# Patient Record
Sex: Female | Born: 1960 | Race: White | Hispanic: Yes | Marital: Married | Smoking: Never smoker
Health system: Southern US, Community
[De-identification: ages and names within clinical notes are randomized; demographics above are authoritative.]

## PROBLEM LIST (undated history)

## (undated) DIAGNOSIS — I10 Essential (primary) hypertension: Secondary | ICD-10-CM

## (undated) DIAGNOSIS — M1990 Unspecified osteoarthritis, unspecified site: Secondary | ICD-10-CM

## (undated) HISTORY — PX: ABDOMINAL HYSTERECTOMY: SHX81

## (undated) HISTORY — PX: CHOLECYSTECTOMY: SHX55

---

## 2019-12-11 LAB — HM DEXA SCAN

## 2021-08-11 ENCOUNTER — Encounter (HOSPITAL_BASED_OUTPATIENT_CLINIC_OR_DEPARTMENT_OTHER): Payer: Self-pay | Admitting: Urology

## 2021-08-11 ENCOUNTER — Inpatient Hospital Stay (HOSPITAL_BASED_OUTPATIENT_CLINIC_OR_DEPARTMENT_OTHER)
Admission: EM | Admit: 2021-08-11 | Discharge: 2021-08-15 | DRG: 493 | Disposition: A | Discharge status: Home Health | Payer: 59 | Attending: Orthopedic Surgery | Admitting: Orthopedic Surgery

## 2021-08-11 ENCOUNTER — Other Ambulatory Visit: Payer: Self-pay

## 2021-08-11 ENCOUNTER — Emergency Department (HOSPITAL_BASED_OUTPATIENT_CLINIC_OR_DEPARTMENT_OTHER): Payer: 59

## 2021-08-11 DIAGNOSIS — S82101A Unspecified fracture of upper end of right tibia, initial encounter for closed fracture: Secondary | ICD-10-CM

## 2021-08-11 DIAGNOSIS — Z20822 Contact with and (suspected) exposure to covid-19: Secondary | ICD-10-CM | POA: Diagnosis present

## 2021-08-11 DIAGNOSIS — Z90710 Acquired absence of both cervix and uterus: Secondary | ICD-10-CM

## 2021-08-11 DIAGNOSIS — S82141A Displaced bicondylar fracture of right tibia, initial encounter for closed fracture: Principal | ICD-10-CM | POA: Diagnosis present

## 2021-08-11 DIAGNOSIS — W1789XA Other fall from one level to another, initial encounter: Secondary | ICD-10-CM | POA: Diagnosis present

## 2021-08-11 DIAGNOSIS — Z9049 Acquired absence of other specified parts of digestive tract: Secondary | ICD-10-CM

## 2021-08-11 DIAGNOSIS — R52 Pain, unspecified: Secondary | ICD-10-CM

## 2021-08-11 DIAGNOSIS — I10 Essential (primary) hypertension: Secondary | ICD-10-CM | POA: Diagnosis present

## 2021-08-11 DIAGNOSIS — M898X9 Other specified disorders of bone, unspecified site: Secondary | ICD-10-CM | POA: Diagnosis present

## 2021-08-11 DIAGNOSIS — D62 Acute posthemorrhagic anemia: Secondary | ICD-10-CM | POA: Diagnosis not present

## 2021-08-11 DIAGNOSIS — F419 Anxiety disorder, unspecified: Secondary | ICD-10-CM | POA: Diagnosis present

## 2021-08-11 DIAGNOSIS — T148XXA Other injury of unspecified body region, initial encounter: Secondary | ICD-10-CM

## 2021-08-11 HISTORY — DX: Essential (primary) hypertension: I10

## 2021-08-11 LAB — BASIC METABOLIC PANEL
Anion gap: 9 (ref 5–15)
BUN: 26 mg/dL — ABNORMAL HIGH (ref 6–20)
CO2: 26 mmol/L (ref 22–32)
Calcium: 8.8 mg/dL — ABNORMAL LOW (ref 8.9–10.3)
Chloride: 101 mmol/L (ref 98–111)
Creatinine, Ser: 0.75 mg/dL (ref 0.44–1.00)
GFR, Estimated: >60 mL/min (ref >60)
Glucose, Bld: 191 mg/dL — ABNORMAL HIGH (ref 70–99)
Potassium: 3.7 mmol/L (ref 3.5–5.1)
Sodium: 136 mmol/L (ref 135–145)

## 2021-08-11 LAB — CBC WITH DIFFERENTIAL/PLATELET
Abs Immature Granulocytes: 0.03 10*3/uL (ref 0.00–0.07)
Basophils Absolute: 0.1 10*3/uL (ref 0.0–0.1)
Basophils Relative: 0 %
Eosinophils Absolute: 0 10*3/uL (ref 0.0–0.5)
Eosinophils Relative: 0 %
HCT: 33.7 % — ABNORMAL LOW (ref 36.0–46.0)
Hemoglobin: 11.4 g/dL — ABNORMAL LOW (ref 12.0–15.0)
Immature Granulocytes: 0 %
Lymphocytes Relative: 13 %
Lymphs Abs: 1.7 10*3/uL (ref 0.7–4.0)
MCH: 30.8 pg (ref 26.0–34.0)
MCHC: 33.8 g/dL (ref 30.0–36.0)
MCV: 91.1 fL (ref 80.0–100.0)
Monocytes Absolute: 0.8 10*3/uL (ref 0.1–1.0)
Monocytes Relative: 6 %
Neutro Abs: 11.1 10*3/uL — ABNORMAL HIGH (ref 1.7–7.7)
Neutrophils Relative %: 81 %
Platelets: 234 10*3/uL (ref 150–400)
RBC: 3.7 MIL/uL — ABNORMAL LOW (ref 3.87–5.11)
RDW: 12.5 % (ref 11.5–15.5)
WBC: 13.6 10*3/uL — ABNORMAL HIGH (ref 4.0–10.5)
nRBC: 0 % (ref 0.0–0.2)

## 2021-08-11 LAB — RESP PANEL BY RT-PCR (FLU A&B, COVID) ARPGX2
Influenza A by PCR: NEGATIVE
Influenza B by PCR: NEGATIVE
SARS Coronavirus 2 by RT PCR: NEGATIVE

## 2021-08-11 LAB — PROTIME-INR
INR: 1 (ref 0.8–1.2)
Prothrombin Time: 13.1 seconds (ref 11.4–15.2)

## 2021-08-11 MED ORDER — LACTATED RINGERS IV SOLN: INTRAVENOUS | Status: DC

## 2021-08-11 MED ORDER — KETOROLAC TROMETHAMINE 15 MG/ML IJ SOLN
15.0000 mg | Freq: Four times a day (QID) | INTRAMUSCULAR | Status: DC
  Administered 2021-08-11 – 2021-08-15 (×15): 15 mg via INTRAVENOUS
  Filled 2021-08-11 (×16): qty 1

## 2021-08-11 MED ORDER — OXYCODONE HCL 5 MG PO TABS
10.0000 mg | ORAL_TABLET | ORAL | Status: DC | PRN
  Administered 2021-08-12: 15 mg via ORAL
  Administered 2021-08-13: 10 mg via ORAL
  Administered 2021-08-14 (×2): 15 mg via ORAL
  Filled 2021-08-11 (×2): qty 3
  Filled 2021-08-11: qty 2
  Filled 2021-08-11: qty 3
  Filled 2021-08-11: qty 2
  Filled 2021-08-11: qty 3

## 2021-08-11 MED ORDER — HYDROMORPHONE HCL 1 MG/ML IJ SOLN
0.5000 mg | INTRAMUSCULAR | Status: DC | PRN
Start: 2021-08-11 — End: 2021-08-16
  Administered 2021-08-11 – 2021-08-13 (×3): 0.5 mg via INTRAVENOUS
  Filled 2021-08-11: qty 1
  Filled 2021-08-11 (×2): qty 0.5

## 2021-08-11 MED ORDER — DEXTROSE 5 % IV SOLN
500.0000 mg | Freq: Three times a day (TID) | INTRAVENOUS | Status: DC
  Filled 2021-08-11: qty 5

## 2021-08-11 MED ORDER — DIPHENHYDRAMINE HCL 12.5 MG/5ML PO ELIX
12.5000 mg | ORAL_SOLUTION | ORAL | Status: DC | PRN
Start: 2021-08-11 — End: 2021-08-16

## 2021-08-11 MED ORDER — DOCUSATE SODIUM 100 MG PO CAPS
100.0000 mg | ORAL_CAPSULE | Freq: Two times a day (BID) | ORAL | Status: DC
  Administered 2021-08-11 – 2021-08-15 (×8): 100 mg via ORAL
  Filled 2021-08-11 (×8): qty 1

## 2021-08-11 MED ORDER — ONDANSETRON HCL 4 MG PO TABS
4.0000 mg | ORAL_TABLET | Freq: Four times a day (QID) | ORAL | Status: DC | PRN
  Administered 2021-08-15: 4 mg via ORAL
  Filled 2021-08-11 (×2): qty 1

## 2021-08-11 MED ORDER — ONDANSETRON HCL 4 MG/2ML IJ SOLN
4.0000 mg | Freq: Four times a day (QID) | INTRAMUSCULAR | Status: DC | PRN
  Administered 2021-08-11 – 2021-08-13 (×3): 4 mg via INTRAVENOUS
  Filled 2021-08-11 (×3): qty 2

## 2021-08-11 MED ORDER — ACETAMINOPHEN 325 MG PO TABS
325.0000 mg | ORAL_TABLET | Freq: Four times a day (QID) | ORAL | Status: DC | PRN
  Filled 2021-08-11: qty 2

## 2021-08-11 MED ORDER — ACETAMINOPHEN 500 MG PO TABS
1000.0000 mg | ORAL_TABLET | Freq: Four times a day (QID) | ORAL | Status: DC
  Administered 2021-08-11 – 2021-08-15 (×14): 1000 mg via ORAL
  Filled 2021-08-11 (×16): qty 2

## 2021-08-11 MED ORDER — OXYCODONE HCL 5 MG PO TABS
5.0000 mg | ORAL_TABLET | ORAL | Status: DC | PRN
  Administered 2021-08-13: 10 mg via ORAL

## 2021-08-11 MED ORDER — HYDROMORPHONE HCL 1 MG/ML IJ SOLN
0.5000 mg | INTRAMUSCULAR | Status: DC | PRN
  Administered 2021-08-11: 1 mg via INTRAVENOUS
  Filled 2021-08-11: qty 1

## 2021-08-11 MED ORDER — METHOCARBAMOL 750 MG PO TABS
750.0000 mg | ORAL_TABLET | Freq: Three times a day (TID) | ORAL | Status: DC
  Administered 2021-08-11 – 2021-08-15 (×8): 750 mg via ORAL
  Filled 2021-08-11 (×10): qty 1
  Filled 2021-08-11: qty 2
  Filled 2021-08-11: qty 1

## 2021-08-11 NOTE — ED Provider Notes (Signed)
[facility] EMERGENCY DEPARTMENT Provider Note   CSN: [id number] Arrival date & time: 08/11/21  1847     History Chief Complaint  Patient presents with   Fall   Knee Injury    Faith Stevens is a 60 y.o. female.  HPI Patient has history of hypertension and is otherwise healthy.  She was standing on about a 3 foot loading dock moving a box and misjudged the edge, she fell backwards and stepped down with all of her weight on the right lower extremity.  The knee buckled and went back when she hit the ground.  Patient reports she got a minor abrasion to her right forearm but otherwise denies other injury.  She denies any loss of consciousness.  She has no headache.  Patient is not anticoagulated.  She denies any back pain weakness numbness or tingling.    No past medical history on file.  There are no problems to display for this patient.      OB History   No obstetric history on file.     No family history on file.     Home Medications Prior to Admission medications   Not on File    Allergies    Patient has no allergy information on record.  Review of Systems   Review of Systems 10 systems reviewed and negative except as per HPI Physical Exam Updated Vital Signs BP 140/67 (BP Location: Left Arm)   Pulse 80   Temp 98.6 F (37 C) (Oral)   Resp 20   Ht 5\' 4"  (1.626 m)   Wt 90.7 kg   SpO2 100%   BMI 34.33 kg/m   Physical Exam Constitutional:      Comments: Alert nontoxic otherwise well in appearance.  Patient is tolerating injury well.  HENT:     Head: Normocephalic and atraumatic.     Mouth/Throat:     Pharynx: Oropharynx is clear.  Eyes:     Extraocular Movements: Extraocular movements intact.     Conjunctiva/sclera: Conjunctivae normal.  Neck:     Comments: No midline C-spine tenderness. Cardiovascular:     Rate and Rhythm: Normal rate and regular rhythm.  Pulmonary:     Effort: Pulmonary effort is normal.     Breath sounds: Normal  breath sounds.  Chest:     Chest wall: No tenderness.  Abdominal:     General: There is no distension.     Palpations: Abdomen is soft.     Tenderness: There is no abdominal tenderness. There is no guarding.  Musculoskeletal:     Comments: Patient's right lower extremity is elevated in the wheelchair.  Lower part of the leg does not have any swelling.  Foot is warm and dry.  Patient can move the foot spontaneously.  Patient has superficial abrasion that is already dressed on the right forearm but has normal range of motion of the elbow and wrist with no deformity and no joint effusions.  Skin:    General: Skin is warm and dry.  Neurological:     General: No focal deficit present.     Mental Status: She is oriented to person, place, and time.     Motor: No weakness.     Coordination: Coordination normal.  Psychiatric:        Mood and Affect: Mood normal.    ED Results / Procedures / Treatments   Labs (all labs ordered are listed, but only abnormal results are displayed) Labs Reviewed  RESP PANEL BY  RT-PCR (FLU A&B, COVID) ARPGX2  BASIC METABOLIC PANEL  CBC WITH DIFFERENTIAL/PLATELET  PROTIME-INR    EKG None  Radiology DG Knee Complete 4 Views Right  Result Date: 08/11/2021 CLINICAL DATA:  Fall with knee pain and swelling EXAM: RIGHT KNEE - COMPLETE 4+ VIEW COMPARISON:  None. FINDINGS: Acute highly comminuted intra-articular fracture involving the proximal tibia with impaction and splaying of fracture fragments by central tibial fracture fragment. Acute highly comminuted fracture involving the head and neck of the fibula with about 1/2 shaft diameter anterior displacement of distal fracture fragment. IMPRESSION: 1. Acute highly comminuted intra-articular proximal tibial fracture with impaction and displacement 2. Acute highly comminuted fracture of the fibular head and neck with displacement Electronically Signed   By: Jasmine Pang M.D.   On: 08/11/2021 20:01     Procedures Procedures   Medications Ordered in ED Medications  HYDROmorphone (DILAUDID) injection 0.5 mg (has no administration in time range)    ED Course  I have reviewed the triage vital signs and the nursing notes.  Pertinent labs & imaging results that were available during my care of the patient were reviewed by me and considered in my medical decision making (see chart for details).    MDM Rules/Calculators/A&P                           Consult: Reviewed with Dr. Marcello Fennel orthopedics, plan for admission.  Patient had a mechanical fall resulting in badly comminuted and impacted tibia and fibula fracture.  No other significant associated injuries.  Patient is otherwise healthy except controlled hypertension.  Patient is tolerating injury well.  Consultation with orthopedics with plan for admission. Final Clinical Impression(s) / ED Diagnoses Final diagnoses:  Closed fracture of proximal end of right tibia and fibula, initial encounter    Rx / DC Orders ED Discharge Orders     None        Arby Barrette, MD 08/11/21 2124

## 2021-08-11 NOTE — ED Triage Notes (Signed)
States fell from loading dock today at 1800, states right knee pain and swelling.

## 2021-08-11 NOTE — ED Notes (Signed)
Pt positioned sideways in backseat of vehicle. Pt helped into locked wheelchair x2 MAX assist with a stand and pivot, maintaining an outstretched position of her RLE d/t increased pain to the knee with movement.

## 2021-08-12 ENCOUNTER — Inpatient Hospital Stay (HOSPITAL_COMMUNITY): Payer: 59

## 2021-08-12 ENCOUNTER — Encounter (HOSPITAL_COMMUNITY): Payer: Self-pay | Admitting: Orthopedic Surgery

## 2021-08-12 DIAGNOSIS — Z9049 Acquired absence of other specified parts of digestive tract: Secondary | ICD-10-CM | POA: Diagnosis not present

## 2021-08-12 DIAGNOSIS — S82141A Displaced bicondylar fracture of right tibia, initial encounter for closed fracture: Secondary | ICD-10-CM | POA: Diagnosis present

## 2021-08-12 DIAGNOSIS — Z20822 Contact with and (suspected) exposure to covid-19: Secondary | ICD-10-CM | POA: Diagnosis present

## 2021-08-12 DIAGNOSIS — S82101A Unspecified fracture of upper end of right tibia, initial encounter for closed fracture: Secondary | ICD-10-CM | POA: Diagnosis present

## 2021-08-12 DIAGNOSIS — F419 Anxiety disorder, unspecified: Secondary | ICD-10-CM | POA: Diagnosis present

## 2021-08-12 DIAGNOSIS — D62 Acute posthemorrhagic anemia: Secondary | ICD-10-CM | POA: Diagnosis not present

## 2021-08-12 DIAGNOSIS — W1789XA Other fall from one level to another, initial encounter: Secondary | ICD-10-CM | POA: Diagnosis present

## 2021-08-12 DIAGNOSIS — I10 Essential (primary) hypertension: Secondary | ICD-10-CM | POA: Diagnosis present

## 2021-08-12 DIAGNOSIS — M898X9 Other specified disorders of bone, unspecified site: Secondary | ICD-10-CM | POA: Diagnosis present

## 2021-08-12 DIAGNOSIS — Z90710 Acquired absence of both cervix and uterus: Secondary | ICD-10-CM | POA: Diagnosis not present

## 2021-08-12 LAB — CBC
HCT: 31.2 % — ABNORMAL LOW (ref 36.0–46.0)
Hemoglobin: 10.4 g/dL — ABNORMAL LOW (ref 12.0–15.0)
MCH: 30.9 pg (ref 26.0–34.0)
MCHC: 33.3 g/dL (ref 30.0–36.0)
MCV: 92.6 fL (ref 80.0–100.0)
Platelets: 210 10*3/uL (ref 150–400)
RBC: 3.37 MIL/uL — ABNORMAL LOW (ref 3.87–5.11)
RDW: 12.5 % (ref 11.5–15.5)
WBC: 11.2 10*3/uL — ABNORMAL HIGH (ref 4.0–10.5)
nRBC: 0 % (ref 0.0–0.2)

## 2021-08-12 LAB — VITAMIN D 25 HYDROXY (VIT D DEFICIENCY, FRACTURES): Vit D, 25-Hydroxy: 39.61 ng/mL (ref 30–100)

## 2021-08-12 LAB — HIV ANTIBODY (ROUTINE TESTING W REFLEX): HIV Screen 4th Generation wRfx: NONREACTIVE

## 2021-08-12 MED ORDER — CEFAZOLIN SODIUM-DEXTROSE 2-4 GM/100ML-% IV SOLN
2.0000 g | INTRAVENOUS | Status: AC
  Administered 2021-08-13: 2 g via INTRAVENOUS
  Filled 2021-08-12: qty 100

## 2021-08-12 MED ORDER — NEBIVOLOL HCL 10 MG PO TABS
20.0000 mg | ORAL_TABLET | Freq: Every day | ORAL | Status: DC
  Administered 2021-08-12 – 2021-08-15 (×4): 20 mg via ORAL
  Filled 2021-08-12 (×5): qty 2

## 2021-08-12 MED ORDER — ALPRAZOLAM 0.5 MG PO TABS
0.5000 mg | ORAL_TABLET | Freq: Every day | ORAL | Status: DC | PRN
  Administered 2021-08-14 – 2021-08-15 (×3): 0.5 mg via ORAL
  Filled 2021-08-12 (×4): qty 1

## 2021-08-12 NOTE — Progress Notes (Signed)
Pt refuses to use the bedpan stating that "every little move makes her pain really bad. "Per Dr Carola Frost it's ok for the pt to use a purewick for tonight but encourage her to use the bedpan.

## 2021-08-12 NOTE — Consult Note (Addendum)
Orthopaedic Trauma Service (OTS) Consult   Patient ID: Faith PollenSandra Stevens MRN: [id number] DOB/AGE: [birth year] 60 y.o.   Reason for Consult: Right bicondylar tibial plateau fracture  Referring Physician: Arby BarretteMarcy Pfeiffer, MD    HPI: Faith PollenSandra Ackers is an 60 y.o. 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  Fell approximately 4 feet directly onto her right leg.  Patient had immediate onset of pain and inability to bear weight.  She was [facility] where she was found to have a comminuted and shortened right bicondylar tibial plateau fracture.  Due to the severity of the injury she was [facility] to the orthopedic trauma service.   Patient is a comfortable pain is primarily located about her right knee.  She does not have any numbness or tingling into her foot but some numbness around the area of injury.  Denies any additional injuries elsewhere including her right hip and left lower extremities.  She did not hit her head or lose consciousness.  No other acute concerns or complaints noted by the patient.  Pain is relieved with rest, ice, elevation and pain medication.  Exacerbated with movement.  Pain is constant in nature.  Fortunately it has not worsened since admission.  Quality of pain is that is associated with fracture  Medical issues include hypertension and anxiety    No known drug allergies Past surgical history includes cholecystectomy and hysterectomy  Patient does not smoke does not drink She does admit to using occasional cannabis edibles.  She does this for anxiety.  Family history is noncontributory  History reviewed. No pertinent past medical history.  Past Surgical History:  Procedure Laterality Date   ABDOMINAL HYSTERECTOMY     CHOLECYSTECTOMY      History reviewed. No pertinent family history.  Social History:  has no history on file for  tobacco use, alcohol use, and drug use.  Allergies: No Known Allergies  Medications: I have reviewed the patient's current medications. Current Meds  Medication Sig   ALPRAZolam (XANAX) 1 MG tablet Take 0.5 mg by mouth daily as needed for anxiety.   Cholecalciferol (VITAMIN D3 PO) Take 1 tablet by mouth daily.   Cyanocobalamin (VITAMIN B-12 PO) Take 1 tablet by mouth daily.   Nebivolol HCl (BYSTOLIC) 20 MG TABS Take 20 mg by mouth every evening.   olmesartan (BENICAR) 20 MG tablet Take 20 mg by mouth daily.     Results for orders placed or performed during the hospital encounter of 08/11/21 (from the past 48 hour(s))  Basic metabolic panel     Status: Abnormal   Collection Time: 08/11/21  8:40 PM  Result Value Ref Range   Sodium 136 135 - 145 mmol/L   Potassium 3.7 3.5 - 5.1 mmol/L   Chloride 101 98 - 111 mmol/L   CO2 26 22 - 32 mmol/L   Glucose, Bld 191 (H) 70 - 99 mg/dL    Comment: Glucose reference range applies only to samples taken after fasting for at least 8 hours.   BUN 26 (H) 6 - 20 mg/dL   Creatinine, Ser 1.190.75 0.44 - 1.00 mg/dL   Calcium 8.8 (L) 8.9 - 10.3 mg/dL   GFR, Estimated >14>60 >78>60 mL/min    Comment: (NOTE) Calculated using the CKD-EPI Creatinine Equation (2021)    Anion gap 9 5 - 15    Comment:  Performed at [facility], [address] [place], [place] [address]  CBC with Differential     Status: Abnormal   Collection Time: 08/11/21  8:40 PM  Result Value Ref Range   WBC 13.6 (H) 4.0 - 10.5 K/uL   RBC 3.70 (L) 3.87 - 5.11 MIL/uL   Hemoglobin 11.4 (L) 12.0 - 15.0 g/dL   HCT 19.1 (L) 47.8 - 29.5 %   MCV 91.1 80.0 - 100.0 fL   MCH 30.8 26.0 - 34.0 pg   MCHC 33.8 30.0 - 36.0 g/dL   RDW 62.1 30.8 - 65.7 %   Platelets 234 150 - 400 K/uL   nRBC 0.0 0.0 - 0.2 %   Neutrophils Relative % 81 %   Neutro Abs 11.1 (H) 1.7 - 7.7 K/uL   Lymphocytes Relative 13 %   Lymphs Abs 1.7 0.7 - 4.0 K/uL   Monocytes Relative 6 %   Monocytes Absolute 0.8 0.1 - 1.0  K/uL   Eosinophils Relative 0 %   Eosinophils Absolute 0.0 0.0 - 0.5 K/uL   Basophils Relative 0 %   Basophils Absolute 0.1 0.0 - 0.1 K/uL   Immature Granulocytes 0 %   Abs Immature Granulocytes 0.03 0.00 - 0.07 K/uL    Comment: Performed at [facility], [address] [place], [place] [address]  Protime-INR     Status: None   Collection Time: 08/11/21  8:40 PM  Result Value Ref Range   Prothrombin Time 13.1 11.4 - 15.2 seconds   INR 1.0 0.8 - 1.2    Comment: (NOTE) INR goal varies based on device and disease states. Performed at [facility], [address] [place], [place] [address]   Resp Panel by RT-PCR (Flu A&B, Covid) Nasopharyngeal Swab     Status: None   Collection Time: 08/11/21  8:41 PM   Specimen: Nasopharyngeal Swab; Nasopharyngeal(NP) swabs in vial transport medium  Result Value Ref Range   SARS Coronavirus 2 by RT PCR NEGATIVE NEGATIVE    Comment: (NOTE) SARS-CoV-2 target nucleic acids are NOT DETECTED.  The SARS-CoV-2 RNA is generally detectable in upper respiratory specimens during the acute phase of infection. The lowest concentration of SARS-CoV-2 viral copies this assay can detect is 138 copies/mL. A negative result does not preclude SARS-Cov-2 infection and should not be used as the sole basis for treatment or other patient management decisions. A negative result may occur with  improper specimen collection/handling, submission of specimen other than nasopharyngeal swab, presence of viral mutation(s) within the areas targeted by this assay, and inadequate number of viral copies(<138 copies/mL). A negative result must be combined with clinical observations, patient history, and epidemiological information. The expected result is Negative.  Fact Sheet for Patients:  BloggerCourse.com  Fact Sheet for Healthcare Providers:  SeriousBroker.it  This test is no t yet approved or cleared  by the [place] FDA and  has been authorized for detection and/or diagnosis of SARS-CoV-2 by FDA under an Emergency Use Authorization (EUA). This EUA will remain  in effect (meaning this test can be used) for the duration of the COVID-19 declaration under Section 564(b)(1) of the Act, 21 U.S.C.section 360bbb-3(b)(1), unless the authorization is terminated  or revoked sooner.       Influenza A by PCR NEGATIVE NEGATIVE   Influenza B by PCR NEGATIVE NEGATIVE    Comment: (NOTE) The Xpert Xpress SARS-CoV-2/FLU/RSV plus assay is intended as an aid in the diagnosis of influenza from  Nasopharyngeal swab specimens and should not be used as a sole basis for treatment. Nasal washings and aspirates are unacceptable for Xpert Xpress SARS-CoV-2/FLU/RSV testing.  Fact Sheet for Patients: BloggerCourse.com  Fact Sheet for Healthcare Providers: SeriousBroker.it  This test is not yet approved or cleared by the [place] FDA and has been authorized for detection and/or diagnosis of SARS-CoV-2 by FDA under an Emergency Use Authorization (EUA). This EUA will remain in effect (meaning this test can be used) for the duration of the COVID-19 declaration under Section 564(b)(1) of the Act, 21 U.S.C. section 360bbb-3(b)(1), unless the authorization is terminated or revoked.  Performed at [facility], [address] [place], [place] [address]   HIV Antibody (routine testing w rflx)     Status: None   Collection Time: 08/12/21  1:42 AM  Result Value Ref Range   HIV Screen 4th Generation wRfx Non Reactive Non Reactive    Comment: Performed at [facility], [address] [place], [place] [address]  CBC     Status: Abnormal   Collection Time: 08/12/21  1:42 AM  Result Value Ref Range   WBC 11.2 (H) 4.0 - 10.5 K/uL   RBC 3.37 (L) 3.87 - 5.11 MIL/uL   Hemoglobin 10.4 (L) 12.0 - 15.0 g/dL   HCT 38.8 (L) 87.5 - 79.7 %   MCV  92.6 80.0 - 100.0 fL   MCH 30.9 26.0 - 34.0 pg   MCHC 33.3 30.0 - 36.0 g/dL   RDW 28.2 06.0 - 15.6 %   Platelets 210 150 - 400 K/uL   nRBC 0.0 0.0 - 0.2 %    Comment: Performed at [facility], [address] [place], [place] [address]  VITAMIN D 25 Hydroxy (Vit-D Deficiency, Fractures)     Status: None   Collection Time: 08/12/21  1:42 AM  Result Value Ref Range   Vit D, 25-Hydroxy 39.61 30 - 100 ng/mL    Comment: (NOTE) Vitamin D deficiency has been defined by the Institute of Medicine  and an Endocrine Society practice guideline as a level of serum 25-OH  vitamin D less than 20 ng/mL (1,2). The Endocrine Society went on to  further define vitamin D insufficiency as a level between 21 and 29  ng/mL (2).  1. IOM (Institute of Medicine). 2010. Dietary reference intakes for  calcium and D. [place]: [employer]. 2. Holick MF, Binkley Treasure, Bischoff-Ferrari HA, et al. Evaluation,  treatment, and prevention of vitamin D deficiency: an Endocrine  Society clinical practice guideline, JCEM. 2011 Jul; 96(7): 1911-30.  Performed at [facility], [address], [place], [place] [address]     DG Knee Complete 4 Views Right  Result Date: 08/11/2021 CLINICAL DATA:  Fall with knee pain and swelling EXAM: RIGHT KNEE - COMPLETE 4+ VIEW COMPARISON:  None. FINDINGS: Acute highly comminuted intra-articular fracture involving the proximal tibia with impaction and splaying of fracture fragments by central tibial fracture fragment. Acute highly comminuted fracture involving the head and neck of the fibula with about 1/2 shaft diameter anterior displacement of distal fracture fragment. IMPRESSION: 1. Acute highly comminuted intra-articular proximal tibial fracture with impaction and displacement 2. Acute highly comminuted fracture of the fibular head and neck with displacement Electronically Signed   By: Jasmine Pang M.D.   On: 08/11/2021 20:01    Intake/Output       08/12 0701 08/13 0700 08/13 0701 08/14 0700   P.O. 240  I.V. (mL/kg) 188.3 (2.1)    Total Intake(mL/kg) 428.3 (4.7)    Urine (mL/kg/hr) 500    Total Output 500    Net -71.7         Urine Occurrence 1 x       Review of Systems  Constitutional:  Negative for chills and fever.  HENT:  Negative for congestion and sinus pain.   Eyes:  Negative for blurred vision.  Respiratory:  Negative for shortness of breath and wheezing.   Cardiovascular:  Negative for chest pain and palpitations.  Gastrointestinal:  Negative for abdominal pain, nausea and vomiting.  Genitourinary:  Negative for dysuria.  Musculoskeletal:        R knee pain   Skin:  Negative for rash.  Neurological:  Negative for dizziness, weakness and headaches.  Endo/Heme/Allergies:  Does not bruise/bleed easily.  Blood pressure 140/77, pulse 80, temperature 97.7 F (36.5 C), temperature source Oral, resp. rate 18, height 5\' 4"  (1.626 m), weight 90.7 kg, SpO2 97 %. Physical Exam Vitals and nursing note reviewed.  Constitutional:      General: She is awake. She is not in acute distress.    Appearance: Normal appearance. She is well-groomed. She is not ill-appearing.     Comments: Very pleasant female, no acute distress appears well.  Very engaged and interactive  HENT:     Head: Normocephalic and atraumatic.     Mouth/Throat:     Mouth: Mucous membranes are moist.  Eyes:     Extraocular Movements: Extraocular movements intact.  Cardiovascular:     Rate and Rhythm: Normal rate and regular rhythm.     Pulses: Normal pulses.     Heart sounds: Normal heart sounds.  Pulmonary:     Effort: Pulmonary effort is normal.     Breath sounds: Normal breath sounds.  Abdominal:     Palpations: Abdomen is soft.     Tenderness: There is no abdominal tenderness.  Musculoskeletal:     Cervical back: Full passive range of motion without pain and normal range of motion. No rigidity or tenderness.     Comments: Right Lower extremity   Mobilizer and bulky compressive dressing are in place No gross deformities noted to foot or ankle.  No deformities noted to the hip Dressing was partially split at the level of the knee.  There is fairly moderate swelling and the skin does not readily wrinkle with gentle compression.  Right lower leg is appreciably larger than the contralateral side however her compartments remain soft and are nontender I do not appreciate any fracture blisters however I did not remove her compressive dressing completely Ankle is stable with evaluation.  Ankle and foot are nontender.  No crepitus or gross motion with manipulation of the ankle.  Hip is unremarkable.  No pain with palpation of her right hip. DPN, SPN, TN sensory functions are intact EHL, FHL, lesser toe motor functions are intact.  Ankle flexion and extension, inversion eversion are intact.   No pain out of proportion with passive stretching of her toes or ankle Extremity is warm + DP pulse  Left lower extremity             no open wounds or lesions, no swelling or ecchymosis   Nontender hip, knee, ankle and foot             No crepitus or gross motion noted with manipulation of the L leg  No knee or ankle effusion  No pain with axial loading or logrolling of the hip. Negative Stinchfield test   Knee stable to varus/ valgus and anterior/posterior stress             No pain with manipulation of the ankle or foot             No blocks to motion noted  Sens DPN, SPN, TN intact  Motor EHL, FHL, lesser toe motor, Ext, flex, evers 5/5  DP 2+, No significant edema             Compartments are soft and nontender, no pain with passive stretching   Bilateral upper extremities     shoulder, elbow, wrist, digits- no skin wounds, nontender, no instability, no blocks to motion  Sens  Ax/R/M/U intact  Mot   Ax/ R/ PIN/ M/ AIN/ U intact  Rad 2+     Skin:    General: Skin is warm.     Capillary Refill: Capillary refill takes less than 2  seconds.  Neurological:     General: No focal deficit present.     Mental Status: She is alert and oriented to person, place, and time. Mental status is at baseline.     Comments: Did not assess coordination or gait  Psychiatric:        Attention and Perception: Attention and perception normal.        Mood and Affect: Mood and affect normal.        Speech: Speech normal.        Behavior: Behavior normal. Behavior is cooperative.        Thought Content: Thought content normal.        Cognition and Memory: Cognition and memory normal.        Judgment: Judgment normal.    Assessment/Plan:  60 y/o female s/p fall off loading dock with comminuted R bicondylar tibial plateau fracture   -Fall off loading dock  -Bicondylar tibial plateau fracture  Patient will require surgical intervention.  Unfortunately her swelling is too severe at the moment to allow for safe definitive fixation.  Plan for external fixation tomorrow morning with delayed ORIF in 10 to 14 days.  CT scan post op to fully characterize fracture pattern to determine if single or dual approach needed    No signs of evolving compartment syndrome   Continue with bulky compressive dressing and knee immobilizer.  Ice and elevate extremity to help with swelling control as well   Okay to start working with therapies   Nonweightbearing right leg.   She will be nonweightbearing for now and for 8 weeks after definitive fixation  - Pain management:  Multimodal  - ABL anemia/Hemodynamics  Stable  Baseline hypertension controlled on home medications - Medical issues   Hypertension   Home medications - DVT/PE prophylaxis:  Start Lovenox today  - ID:   Perioperative antibiotics  - Metabolic Bone Disease:  Vitamin D levels look okay.  Continue with routine supplementation.  Mechanism was fairly high-energy however patient would benefit from having a bone density scan in the next 4 to 8 weeks as she is postmenopausal  -  Activity:  Out of bed with assistance, nonweightbearing right leg  - FEN/GI prophylaxis/Foley/Lines:  Regular diet  NPO after MN   IVF LR at 100 cc/hr  - Dispo:  OR tomorrow for external fixation right leg  Therapy evaluations  Likely dc Tuesday     Mearl Latin, PA-C [phone] (C) 08/12/2021, 10:16 AM  [facility] [address] [place] [address] [phone] Collier Bullock (F)    After 5pm and on the weekends please log on to Amion, go to orthopaedics and the look under the [facility] Call for the provider(s) on call. You can also call our office at [phone] and then follow the prompts to be connected to the call team.

## 2021-08-12 NOTE — Plan of Care (Signed)
  Problem: Education: Goal: Knowledge of General Education information will improve Description: Including pain rating scale, medication(s)/side effects and non-pharmacologic comfort measures Outcome: Progressing   Problem: Coping: Goal: Level of anxiety will decrease Outcome: Progressing   Problem: Pain Managment: Goal: General experience of comfort will improve Outcome: Progressing   

## 2021-08-13 ENCOUNTER — Encounter (HOSPITAL_COMMUNITY)
Admission: EM | Disposition: A | Discharge status: Home Health | Payer: Self-pay | Source: Home / Self Care | Attending: Orthopedic Surgery

## 2021-08-13 ENCOUNTER — Encounter (HOSPITAL_COMMUNITY): Payer: Self-pay | Admitting: Orthopedic Surgery

## 2021-08-13 ENCOUNTER — Inpatient Hospital Stay (HOSPITAL_COMMUNITY): Payer: 59 | Admitting: Anesthesiology

## 2021-08-13 ENCOUNTER — Inpatient Hospital Stay (HOSPITAL_COMMUNITY): Payer: 59

## 2021-08-13 HISTORY — PX: EXTERNAL FIXATION LEG: SHX1549

## 2021-08-13 LAB — SURGICAL PCR SCREEN
MRSA, PCR: NEGATIVE
Staphylococcus aureus: NEGATIVE

## 2021-08-13 SURGERY — EXTERNAL FIXATION, LOWER EXTREMITY
Anesthesia: General | Site: Leg Lower | Laterality: Right

## 2021-08-13 MED ORDER — ACETAMINOPHEN 10 MG/ML IV SOLN: 1000.0000 mg | Freq: Once | INTRAVENOUS | Status: DC | PRN

## 2021-08-13 MED ORDER — FENTANYL CITRATE (PF) 100 MCG/2ML IJ SOLN
25.0000 ug | INTRAMUSCULAR | Status: DC | PRN
  Administered 2021-08-13: 50 ug via INTRAVENOUS
  Administered 2021-08-13 (×2): 25 ug via INTRAVENOUS

## 2021-08-13 MED ORDER — ONDANSETRON HCL 4 MG/2ML IJ SOLN
INTRAMUSCULAR | Status: DC | PRN
  Administered 2021-08-13: 4 mg via INTRAVENOUS

## 2021-08-13 MED ORDER — MIDAZOLAM HCL 2 MG/2ML IJ SOLN
INTRAMUSCULAR | Status: DC | PRN
  Administered 2021-08-13: 2 mg via INTRAVENOUS

## 2021-08-13 MED ORDER — MIDAZOLAM HCL 2 MG/2ML IJ SOLN
INTRAMUSCULAR | Status: AC
  Filled 2021-08-13: qty 2

## 2021-08-13 MED ORDER — KETAMINE HCL 50 MG/5ML IJ SOSY
PREFILLED_SYRINGE | INTRAMUSCULAR | Status: AC
  Filled 2021-08-13: qty 5

## 2021-08-13 MED ORDER — OXYCODONE HCL 5 MG/5ML PO SOLN
5.0000 mg | Freq: Once | ORAL | Status: DC | PRN
Start: 2021-08-13 — End: 2021-08-13

## 2021-08-13 MED ORDER — DEXAMETHASONE SODIUM PHOSPHATE 10 MG/ML IJ SOLN
INTRAMUSCULAR | Status: DC | PRN
  Administered 2021-08-13: 8 mg via INTRAVENOUS

## 2021-08-13 MED ORDER — LACTATED RINGERS IV SOLN: INTRAVENOUS | Status: DC | PRN

## 2021-08-13 MED ORDER — PHENYLEPHRINE 40 MCG/ML (10ML) SYRINGE FOR IV PUSH (FOR BLOOD PRESSURE SUPPORT)
PREFILLED_SYRINGE | INTRAVENOUS | Status: DC | PRN
  Administered 2021-08-13: 80 ug via INTRAVENOUS

## 2021-08-13 MED ORDER — ORAL CARE MOUTH RINSE: 15.0000 mL | Freq: Once | OROMUCOSAL | Status: AC

## 2021-08-13 MED ORDER — 0.9 % SODIUM CHLORIDE (POUR BTL) OPTIME
TOPICAL | Status: DC | PRN
  Administered 2021-08-13: 1000 mL

## 2021-08-13 MED ORDER — ONDANSETRON HCL 4 MG/2ML IJ SOLN
INTRAMUSCULAR | Status: AC
  Filled 2021-08-13: qty 2

## 2021-08-13 MED ORDER — OXYCODONE HCL 5 MG PO TABS: 5.0000 mg | ORAL_TABLET | Freq: Once | ORAL | Status: DC | PRN

## 2021-08-13 MED ORDER — LACTATED RINGERS IV SOLN: INTRAVENOUS | Status: DC

## 2021-08-13 MED ORDER — KETAMINE HCL 10 MG/ML IJ SOLN
INTRAMUSCULAR | Status: DC | PRN
  Administered 2021-08-13 (×2): 20 mg via INTRAVENOUS

## 2021-08-13 MED ORDER — PROPOFOL 10 MG/ML IV BOLUS
INTRAVENOUS | Status: AC
  Filled 2021-08-13: qty 20

## 2021-08-13 MED ORDER — FENTANYL CITRATE (PF) 100 MCG/2ML IJ SOLN
INTRAMUSCULAR | Status: AC
  Filled 2021-08-13: qty 2

## 2021-08-13 MED ORDER — PHENYLEPHRINE HCL-NACL 20-0.9 MG/250ML-% IV SOLN
INTRAVENOUS | Status: DC | PRN
Start: 2021-08-13 — End: 2021-08-13
  Administered 2021-08-13: 25 ug/min via INTRAVENOUS

## 2021-08-13 MED ORDER — DEXAMETHASONE SODIUM PHOSPHATE 10 MG/ML IJ SOLN
INTRAMUSCULAR | Status: AC
  Filled 2021-08-13: qty 1

## 2021-08-13 MED ORDER — CHLORHEXIDINE GLUCONATE 0.12 % MT SOLN
OROMUCOSAL | Status: AC
  Filled 2021-08-13: qty 15

## 2021-08-13 MED ORDER — FENTANYL CITRATE (PF) 250 MCG/5ML IJ SOLN
INTRAMUSCULAR | Status: DC | PRN
  Administered 2021-08-13 (×2): 50 ug via INTRAVENOUS

## 2021-08-13 MED ORDER — FENTANYL CITRATE (PF) 250 MCG/5ML IJ SOLN
INTRAMUSCULAR | Status: AC
  Filled 2021-08-13: qty 5

## 2021-08-13 MED ORDER — PHENYLEPHRINE 40 MCG/ML (10ML) SYRINGE FOR IV PUSH (FOR BLOOD PRESSURE SUPPORT)
PREFILLED_SYRINGE | INTRAVENOUS | Status: AC
  Filled 2021-08-13: qty 10

## 2021-08-13 MED ORDER — ACETAMINOPHEN 160 MG/5ML PO SOLN: 1000.0000 mg | Freq: Once | ORAL | Status: DC | PRN

## 2021-08-13 MED ORDER — ACETAMINOPHEN 500 MG PO TABS: 1000.0000 mg | ORAL_TABLET | Freq: Once | ORAL | Status: DC | PRN

## 2021-08-13 MED ORDER — PROPOFOL 10 MG/ML IV BOLUS
INTRAVENOUS | Status: DC | PRN
  Administered 2021-08-13: 150 mg via INTRAVENOUS

## 2021-08-13 MED ORDER — CHLORHEXIDINE GLUCONATE 0.12 % MT SOLN
15.0000 mL | Freq: Once | OROMUCOSAL | Status: AC
  Administered 2021-08-13: 15 mL via OROMUCOSAL

## 2021-08-13 SURGICAL SUPPLY — 45 items
BANDAGE ESMARK 6X9 LF (GAUZE/BANDAGES/DRESSINGS) ×1 IMPLANT
BAR EXFX 500X11 NS LF (EXFIX) ×2
BAR GLASS FIBER EXFX 11X500 (EXFIX) ×4 IMPLANT
BNDG CMPR 9X6 STRL LF SNTH (GAUZE/BANDAGES/DRESSINGS) ×1
BNDG ELASTIC 4X5.8 VLCR STR LF (GAUZE/BANDAGES/DRESSINGS) ×2 IMPLANT
BNDG ELASTIC 6X5.8 VLCR STR LF (GAUZE/BANDAGES/DRESSINGS) ×2 IMPLANT
BNDG ESMARK 6X9 LF (GAUZE/BANDAGES/DRESSINGS) ×2
BNDG GAUZE ELAST 4 BULKY (GAUZE/BANDAGES/DRESSINGS) ×4 IMPLANT
BRUSH SCRUB EZ PLAIN DRY (MISCELLANEOUS) ×4 IMPLANT
COVER SURGICAL LIGHT HANDLE (MISCELLANEOUS) ×4 IMPLANT
DRAPE C-ARM 42X72 X-RAY (DRAPES) ×2 IMPLANT
DRAPE C-ARMOR (DRAPES) ×2 IMPLANT
DRAPE U-SHAPE 47X51 STRL (DRAPES) ×2 IMPLANT
DRSG ADAPTIC 3X8 NADH LF (GAUZE/BANDAGES/DRESSINGS) ×2 IMPLANT
DRSG MEPITEL 4X7.2 (GAUZE/BANDAGES/DRESSINGS) ×2 IMPLANT
ELECT REM PT RETURN 9FT ADLT (ELECTROSURGICAL) ×2
ELECTRODE REM PT RTRN 9FT ADLT (ELECTROSURGICAL) ×1 IMPLANT
GAUZE SPONGE 4X4 12PLY STRL (GAUZE/BANDAGES/DRESSINGS) ×2 IMPLANT
GLOVE SRG 8 PF TXTR STRL LF DI (GLOVE) ×1 IMPLANT
GLOVE SURG ENC MOIS LTX SZ7.5 (GLOVE) ×2 IMPLANT
GLOVE SURG ENC MOIS LTX SZ8 (GLOVE) ×2 IMPLANT
GLOVE SURG UNDER POLY LF SZ7.5 (GLOVE) ×2 IMPLANT
GLOVE SURG UNDER POLY LF SZ8 (GLOVE) ×2
GOWN STRL REUS W/ TWL LRG LVL3 (GOWN DISPOSABLE) ×2 IMPLANT
GOWN STRL REUS W/ TWL XL LVL3 (GOWN DISPOSABLE) ×1 IMPLANT
GOWN STRL REUS W/TWL LRG LVL3 (GOWN DISPOSABLE) ×4
GOWN STRL REUS W/TWL XL LVL3 (GOWN DISPOSABLE) ×2
HALF PIN 5.0X160 (EXFIX) ×4 IMPLANT
KIT BASIN OR (CUSTOM PROCEDURE TRAY) ×2 IMPLANT
KIT TURNOVER KIT B (KITS) ×2 IMPLANT
NS IRRIG 1000ML POUR BTL (IV SOLUTION) ×2 IMPLANT
PACK ORTHO EXTREMITY (CUSTOM PROCEDURE TRAY) ×2 IMPLANT
PAD ARMBOARD 7.5X6 YLW CONV (MISCELLANEOUS) ×4 IMPLANT
PAD CAST 4YDX4 CTTN HI CHSV (CAST SUPPLIES) ×2 IMPLANT
PADDING CAST COTTON 4X4 STRL (CAST SUPPLIES) ×4
PADDING CAST COTTON 6X4 STRL (CAST SUPPLIES) ×2 IMPLANT
PIN CLAMP 2BAR 75MM BLUE (EXFIX) ×4 IMPLANT
PIN HALF YELLOW 5X160X35 (EXFIX) ×4 IMPLANT
SPONGE T-LAP 18X18 ~~LOC~~+RFID (SPONGE) ×2 IMPLANT
STAPLER VISISTAT 35W (STAPLE) ×2 IMPLANT
STRIP CLOSURE SKIN 1/2X4 (GAUZE/BANDAGES/DRESSINGS) IMPLANT
TOWEL GREEN STERILE (TOWEL DISPOSABLE) ×2 IMPLANT
TOWEL GREEN STERILE FF (TOWEL DISPOSABLE) ×2 IMPLANT
TRAY CATH INTERMITTENT SS 16FR (CATHETERS) ×2 IMPLANT
UNDERPAD 30X36 HEAVY ABSORB (UNDERPADS AND DIAPERS) ×2 IMPLANT

## 2021-08-13 NOTE — Anesthesia Preprocedure Evaluation (Signed)
Anesthesia Evaluation  Patient identified by MRN, date of birth, ID band Patient awake    Reviewed: Allergy & Precautions, NPO status , Patient's Chart, lab work & pertinent test results  History of Anesthesia Complications Negative for: history of anesthetic complications  Airway Mallampati: II  TM Distance: >3 FB Neck ROM: Full    Dental  (+) Dental Advisory Given, Teeth Intact   Pulmonary neg pulmonary ROS,  Covid-19 Nucleic Acid Test Results Lab Results      Component                Value               Date                      SARSCOV2NAA              NEGATIVE            08/11/2021              breath sounds clear to auscultation       Cardiovascular hypertension, Pt. on medications (-) angina(-) Past MI and (-) CHF  Rhythm:Regular     Neuro/Psych negative neurological ROS  negative psych ROS   GI/Hepatic negative GI ROS, Neg liver ROS,   Endo/Other  negative endocrine ROS  Renal/GU negative Renal ROS     Musculoskeletal  right bicondylar tibial plateau fracture   Abdominal   Peds  Hematology negative hematology ROS (+)   Anesthesia Other Findings   Reproductive/Obstetrics                             Anesthesia Physical Anesthesia Plan  ASA: 2  Anesthesia Plan: General   Post-op Pain Management:    Induction: Intravenous  PONV Risk Score and Plan: 3 and Ondansetron and Dexamethasone  Airway Management Planned: LMA  Additional Equipment: None  Intra-op Plan:   Post-operative Plan: Extubation in OR  Informed Consent: I have reviewed the patients History and Physical, chart, labs and discussed the procedure including the risks, benefits and alternatives for the proposed anesthesia with the patient or authorized representative who has indicated his/her understanding and acceptance.     Dental advisory given  Plan Discussed with: CRNA and  Anesthesiologist  Anesthesia Plan Comments:         Anesthesia Quick Evaluation

## 2021-08-13 NOTE — Progress Notes (Signed)
OT Cancellation Note  Patient Details Name: Faith Stevens MRN: [id number] DOB: July 02, 1961   Cancelled Treatment:    Reason Eval/Treat Not Completed: Patient at procedure or test/ unavailable (Pt off of floor to OR for external fixator placement, OT eval to f/u as appropriate)  Accalia Rigdon A Oria Klimas 08/13/2021, 8:39 AM

## 2021-08-13 NOTE — Progress Notes (Signed)
Occupational Therapy Evaluation Patient Details Name: Faith Stevens MRN: [id number] DOB: 1961-02-13 Today's Date: 08/13/2021    History of Present Illness Faith Stevens is a 60 y.o. female who presented to [facility] after  falling 3 foot off of a loading dock while moving a box, ED work up showed Bicondylar tibial plateau fracture. Pt is s/p external fixator 8/14 with plans for delayed ORIF in 10 to 14 days. Patient has history of hypertension and is otherwise healthy   Clinical Impression   Faith Stevens was evaluated s/p the above ex fix placement, she stated feeling "loopy" at the start of the session but agreeable to participate. PTA pt was indep in all ADL/ADLs, she lives on the main level of their home with her husband and family member who plan to take turns to provide pt with 24/7 care at d/c. After history was obtained and pt questions answered, we attempted to completedbed mobility and her pain went to 10/10 and she declined to further mobilize. Pt is anxious about the pain and has many questions of the process.  Pt will benefit from OT acutely to progress mobility and ADLs. Recommend d/c home with HHOT and 24/7 support.    Follow Up Recommendations  Home health OT;Supervision/Assistance - 24 hour    Equipment Recommendations  3 in 1 bedside commode;Wheelchair (measurements OT) (WC with elevatign leg rest, RW)       Precautions / Restrictions Precautions Precautions: Fall Precaution Comments: ex fix Restrictions Weight Bearing Restrictions: Yes RLE Weight Bearing: Non weight bearing      Mobility Bed Mobility Overal bed mobility: Needs Assistance Bed Mobility: Rolling Rolling: Max assist         General bed mobility comments: pt unable to complete bed mobility due to pain, attempted to adjust, scoot and roll in bed but pt reported 1010 pain    Transfers                 General transfer comment: pt declined    Balance Overall balance assessment: Needs assistance           ADL either performed or assessed with clinical judgement   ADL Overall ADL's : Needs assistance/impaired Eating/Feeding: Set up;Sitting   Grooming: Set up;Sitting   Upper Body Bathing: Set up;Sitting   Lower Body Bathing: Maximal assistance;Sit to/from stand;+2 for physical assistance;+2 for safety/equipment   Upper Body Dressing : Set up;Sitting   Lower Body Dressing: Maximal assistance;+2 for physical assistance;+2 for safety/equipment;Sit to/from stand   Toilet Transfer: Maximal assistance;+2 for physical assistance;+2 for safety/equipment;Stand-pivot;BSC;RW   Toileting- Clothing Manipulation and Hygiene: Maximal assistance;+2 for physical assistance;+2 for safety/equipment;Sit to/from stand       Functional mobility during ADLs: Maximal assistance;+2 for physical assistance;+2 for safety/equipment;Rolling walker General ADL Comments: Pt limited by painand axiety this session     Vision Patient Visual Report: No change from baseline Vision Assessment?: No apparent visual deficits            Pertinent Vitals/Pain Pain Assessment: 0-10 Pain Score: 8  Pain Location: RLE, knee mostly Pain Descriptors / Indicators: Discomfort;Grimacing Pain Intervention(s): Limited activity within patient's tolerance;Monitored during session     Hand Dominance     Extremity/Trunk Assessment Upper Extremity Assessment Upper Extremity Assessment: Overall WFL for tasks assessed   Lower Extremity Assessment Lower Extremity Assessment: Defer to PT evaluation   Cervical / Trunk Assessment Cervical / Trunk Assessment: Normal   Communication Communication Communication: No difficulties   Cognition Arousal/Alertness: Awake/alert Behavior During Therapy: [facility] for  tasks assessed/performed;Anxious Overall Cognitive Status: Within Functional Limits for tasks assessed         General Comments  Pt reported feeling "loopy" at the start of the session, likely due to meducation. She  was agreeable to the session however on attempt to move/adjust her pain went to 1010 and she declined further mobility     Home Living Family/patient expects to be discharged to:: Private residence Living Arrangements: Spouse/significant other Available Help at Discharge: Family;Available 24 hours/day Type of Home: House Home Access: Stairs to enter [employer] of Steps: 5 Entrance Stairs-Rails: Right Home Layout: Two level;Able to live on main level with bedroom/bathroom     Bathroom Shower/Tub: Tub/shower unit;Walk-in shower   Bathroom Toilet: Standard Bathroom Accessibility: Yes How Accessible: Accessible via walker Home Equipment: None          Prior Functioning/Environment Level of Independence: Independent                 OT Problem List: Decreased range of motion;Decreased strength;Decreased activity tolerance;Impaired balance (sitting and/or standing);Decreased safety awareness;Decreased knowledge of use of DME or AE;Decreased knowledge of precautions;Pain      OT Treatment/Interventions: Self-care/ADL training;Therapeutic exercise;Therapeutic activities;Patient/family education;Balance training;DME and/or AE instruction    OT Goals(Current goals can be found in the care plan section) Acute Rehab OT Goals Patient Stated Goal: home to shower OT Goal Formulation: With patient Time For Goal Achievement: 08/27/21 Potential to Achieve Goals: Fair ADL Goals Pt Will Perform Lower Body Bathing: with supervision;sitting/lateral leans Pt Will Perform Lower Body Dressing: with min assist;with adaptive equipment;sitting/lateral leans Pt Will Transfer to Toilet: with min guard assist;stand pivot transfer;bedside commode Pt Will Perform Tub/Shower Transfer: with min guard assist;ambulating;shower seat;rolling walker  OT Frequency: Min 2X/week   Barriers to D/C: Inaccessible home environment  5 STE          AM-PAC OT "6 Clicks" Daily Activity     Outcome  Measure Help from another person eating meals?: None Help from another person taking care of personal grooming?: A Little Help from another person toileting, which includes using toliet, bedpan, or urinal?: A Lot Help from another person bathing (including washing, rinsing, drying)?: A Lot Help from another person to put on and taking off regular upper body clothing?: A Little Help from another person to put on and taking off regular lower body clothing?: A Lot 6 Click Score: 16   End of Session Nurse Communication: Mobility status;Weight bearing status;Precautions  Activity Tolerance: Patient limited by pain Patient left: in bed;with call bell/phone within reach;with family/visitor present  OT Visit Diagnosis: Other abnormalities of gait and mobility (R26.89);Muscle weakness (generalized) (M62.81);Pain                Time: 9509-3267 OT Time Calculation (min): 18 min Charges:  OT General Charges $OT Visit: 1 Visit OT Evaluation $OT Eval Moderate Complexity: 1 Mod   Faith Stevens A Lynnleigh Soden 08/13/2021, 4:22 PM

## 2021-08-13 NOTE — Progress Notes (Signed)
Patient stated that she always takes her Benicar 20 mg PO in the mornings and before surgery.  Paging on call MD for order since this medication is not listed on her MAR.  Awaiting call back.  [phone] On call MD gave order for Benicar 20 mg PO for morning.

## 2021-08-13 NOTE — Op Note (Signed)
PATIENT:  Ayan Yankey  60 y.o.  PRE-OPERATIVE DIAGNOSIS:  Right bicondylar tibial plateau fracture  POST-OPERATIVE DIAGNOSIS:  Right bicondylar tibial plateau fracture  PROCEDURE:  Procedure(s): 1. CLOSED REDUCTION OF RIGHT TIBIAL PLATEAU 2. APPLICATION OF SPANNING EXTERNAL FIXATOR RIGHT KNEE  SURGEON:  Surgeon(s) and Role:    * Myrene Galas, MD - Primary  PHYSICIAN ASSISTANT: Montez Morita, PA-C  ANESTHESIA:   general  EBL:  minimal   BLOOD ADMINISTERED:none  DRAINS: none   LOCAL MEDICATIONS USED:  NONE  SPECIMEN:  No Specimen  DISPOSITION OF SPECIMEN:  N/A  COUNTS:  YES  TOURNIQUET:  * No tourniquets in log *  DICTATION: .Note written in EPIC  PLAN OF CARE: Admit to inpatient   PATIENT DISPOSITION:  PACU - hemodynamically stable.   Delay start of Pharmacological VTE agent (>24hrs) due to surgical blood loss or risk of bleeding: no  BRIEF SUMMARY OF INDICATIONS FOR PROCEDURE:  Cendy Oconnor is a 60 y.o. -year-old who sustained knee injury from fall from loading dock at work with severe impacted and displaced fractures of the tibial plateau. The patient was seen and evaluated preoperatively where substantial swelling and blisters but no pain with passive stretch was noted. I discussed the possibility of pin tract infection, the need for staged procedures, heart attack, stroke, anesthetic reaction, possible need for skin grafting, malunion, nonunion, infection, and multiple others.  BRIEF SUMMARY OF PROCEDURE:  After administration of preoperative antibiotics, the patient was taken to the operating room where general anesthesia was induced. The right lower extremity was prepped and draped in usual sterile fashion while carefully holding traction.  After time-out, C-arm was brought in.  Appropriate starting points were identified on the femur and tibia using a clamp for proper spacing and orientation.  Small stab incisions were made.  Tonsil clamp used to spread down to  bone.  The pins were advanced to the far cortex.  These were then checked with lateral flouro images to confirm appropriate position just into the far cortex.  After applying and securing clamps on the femur and the tibia, two bars were placed and a closed manipulation performed of both the tibial plateau and the shaft area restoring appropriate length, alignment, and rotation.  Once this was confirmed with AP and lateral C-arm images, my assistant terminally tightened all of the clamps. We dressed the pin sites with Kerlix and applied a sterile gently compressive dressing from foot to thigh, including application of Mepitel and guaze directly over the fracture blisters. An assistant was absolutely necessary as I had to produce and control the reduction while an assistant adjusted and tightened the clamps.  PROGNOSIS:  The patient will need to return to the OR for definitive treatment in approximately two weeks. CT scan will be ordered for surgical planning and DVT prophylaxis initiated. The magnitude of articular and soft tissue injury also increases the risks of arthritis and soft tissue complications including infection.   Doralee Albino. Carola Frost, M.D.

## 2021-08-13 NOTE — Transfer of Care (Signed)
Immediate Anesthesia Transfer of Care Note  Patient: Faith Stevens  Procedure(s) Performed: EXTERNAL FIXATION LEG (Right: Leg Lower)  Patient Location: PACU  Anesthesia Type:General  Level of Consciousness: drowsy and patient cooperative  Airway & Oxygen Therapy: Patient Spontanous Breathing and Patient connected to face mask oxygen  Post-op Assessment: Report given to RN and Post -op Vital signs reviewed and stable  Post vital signs: Reviewed and stable  Last Vitals:  Vitals Value Taken Time  BP    Temp    Pulse    Resp    SpO2      Last Pain:  Vitals:   08/13/21 0004  TempSrc:   PainSc: 5       Patients Stated Pain Goal: 3 (08/13/21 0004)  Complications: No notable events documented.

## 2021-08-13 NOTE — Progress Notes (Signed)
Tried to put in the order for Benicar PO. Hospital does not carry it.  There is an alternate to Benicar called Avapro.  Patient does not want the Avapro.  She stated that she has her own supply of Benicar and if the MD approves she will take it in the morning.

## 2021-08-13 NOTE — Progress Notes (Signed)
Patient is requesting to take her home med Benicar yet again.  I have explain several times that this medication is not on her MAR and that she did not want the hospital alternative Avapro.  I can not tell her to take home medications or medications not on her MAR.  She can discuss this with MD before surgery this morning as she agreed to do at 0100.  Her morning BP was 139/814 and HR 76.

## 2021-08-13 NOTE — Progress Notes (Signed)
PT Cancellation Note  Patient Details Name: Faith Stevens MRN: [id number] DOB: 12/09/1961   Cancelled Treatment:    Reason Eval/Treat Not Completed: Patient at procedure or test/unavailable (to OR for external fixator placement). PT will continue to follow-up with pt acutely as available and appropriate.    Alessandra Bevels Chiquita Heckert 08/13/2021, 8:18 AM

## 2021-08-13 NOTE — Anesthesia Procedure Notes (Signed)
Procedure Name: LMA Insertion Date/Time: 08/13/2021 9:50 AM Performed by: Modena Morrow, CRNA Pre-anesthesia Checklist: Patient identified, Emergency Drugs available, Suction available and Patient being monitored Patient Re-evaluated:Patient Re-evaluated prior to induction Oxygen Delivery Method: Circle system utilized Preoxygenation: Pre-oxygenation with 100% oxygen Induction Type: IV induction Ventilation: Mask ventilation without difficulty LMA: LMA inserted LMA Size: 4.0 Placement Confirmation: positive ETCO2 Tube secured with: Tape Dental Injury: Teeth and Oropharynx as per pre-operative assessment

## 2021-08-14 ENCOUNTER — Encounter (HOSPITAL_COMMUNITY): Payer: Self-pay | Admitting: Orthopedic Surgery

## 2021-08-14 DIAGNOSIS — F419 Anxiety disorder, unspecified: Secondary | ICD-10-CM

## 2021-08-14 DIAGNOSIS — I10 Essential (primary) hypertension: Secondary | ICD-10-CM | POA: Diagnosis present

## 2021-08-14 HISTORY — DX: Anxiety disorder, unspecified: F41.9

## 2021-08-14 HISTORY — DX: Essential (primary) hypertension: I10

## 2021-08-14 MED ORDER — ALPRAZOLAM 0.5 MG PO TABS: 0.5000 mg | ORAL_TABLET | Freq: Every day | ORAL | Status: DC | PRN

## 2021-08-14 MED ORDER — OLMESARTAN MEDOXOMIL 20 MG PO TABS
20.0000 mg | ORAL_TABLET | Freq: Every day | ORAL | Status: DC
  Administered 2021-08-14 – 2021-08-15 (×2): 20 mg via ORAL
  Filled 2021-08-14 (×2): qty 1

## 2021-08-14 MED ORDER — ENOXAPARIN SODIUM 40 MG/0.4ML IJ SOSY
40.0000 mg | PREFILLED_SYRINGE | INTRAMUSCULAR | Status: DC
  Administered 2021-08-14 – 2021-08-15 (×2): 40 mg via SUBCUTANEOUS
  Filled 2021-08-14 (×2): qty 0.4

## 2021-08-14 MED ORDER — VITAMIN D 25 MCG (1000 UNIT) PO TABS
1000.0000 [IU] | ORAL_TABLET | Freq: Every day | ORAL | Status: DC
  Administered 2021-08-14 – 2021-08-15 (×2): 1000 [IU] via ORAL
  Filled 2021-08-14 (×3): qty 1

## 2021-08-14 NOTE — Evaluation (Signed)
Physical Therapy Evaluation Patient Details Name: Faith Stevens MRN: [id number] DOB: January 14, 1961 Today's Date: 08/14/2021   History of Present Illness  Faith Stevens is a 60 y.o. female who presented to [facility] after  falling 3 foot off of a loading dock while moving a box, ED work up showed Bicondylar tibial plateau fracture. Pt is s/p external fixator 8/14 with plans for delayed ORIF in 10 to 14 days. Patient has history of hypertension and is otherwise healthy  Clinical Impression   Pt presents with impaired strength, RLE post-operative pain, impaired mobility, impaired balance, and decreased activity tolerance vs baseline. Pt to benefit from acute PT to address deficits. Pt tolerated stand pivot to recliner from bed, overall requiring min assist +2 for mobility. At baseline, pt completely independent. PT to progress mobility as tolerated, and will continue to follow acutely.      Follow Up Recommendations Home health PT;Supervision/Assistance - 24 hour    Equipment Recommendations  Rolling walker with 5" wheels;3in1 (PT);Wheelchair cushion (measurements PT);Wheelchair (measurements PT)    Recommendations for Other Services       Precautions / Restrictions Precautions Precautions: Fall Precaution Comments: ex fix - PT educated pt on using exfix bars to lift LE as opposed to pulling on RLE itself Restrictions Weight Bearing Restrictions: Yes RLE Weight Bearing: Non weight bearing      Mobility  Bed Mobility Overal bed mobility: Needs Assistance Bed Mobility: Supine to Sit Rolling: Min assist         General bed mobility comments: min A to manage RLE, pt with increased time and effort to scoot to EOB.    Transfers Overall transfer level: Needs assistance Equipment used: Rolling walker (2 wheeled) Transfers: Sit to/from [employer] Sit to Stand: Min assist;+2 physical assistance;+2 safety/equipment Stand pivot transfers: Min assist;+2 physical  assistance;+2 safety/equipment       General transfer comment: +2 this session for safety only, pt can progress with +1 for transfers. +2 helpful to progress ambulation distances. Assist for rise, steady, and safe pivot. PT reinforcing NWB RLE throughout  Ambulation/Gait                Stairs            Wheelchair Mobility    Modified Rankin (Stroke Patients Only)       Balance Overall balance assessment: Needs assistance Sitting-balance support: No upper extremity supported;Feet supported Sitting balance-Leahy Scale: Fair     Standing balance support: Bilateral upper extremity supported;During functional activity Standing balance-Leahy Scale: Poor Standing balance comment: reliant on external support                             Pertinent Vitals/Pain Pain Assessment: Faces Faces Pain Scale: Hurts little more Pain Location: RLE Pain Descriptors / Indicators: Discomfort;Grimacing Pain Intervention(s): Limited activity within patient's tolerance;Monitored during session;Repositioned    Home Living Family/patient expects to be discharged to:: Private residence Living Arrangements: Spouse/significant other Available Help at Discharge: Family;Available 24 hours/day Type of Home: House Home Access: Stairs to enter Entrance Stairs-Rails: Right Entrance Stairs-Number of Steps: 5 Home Layout: Two level;Able to live on main level with bedroom/bathroom Home Equipment: None      Prior Function Level of Independence: Independent               Hand Dominance        Extremity/Trunk Assessment   Upper Extremity Assessment Upper Extremity Assessment: Defer to OT evaluation  Lower Extremity Assessment Lower Extremity Assessment: RLE deficits/detail RLE Deficits / Details: ex fix limiting RLE eval RLE: Unable to fully assess due to immobilization    Cervical / Trunk Assessment Cervical / Trunk Assessment: Normal  Communication    Communication: No difficulties  Cognition Arousal/Alertness: Awake/alert Behavior During Therapy: WFL for tasks assessed/performed Overall Cognitive Status: Within Functional Limits for tasks assessed                                 General Comments: some anxiety with mobility      General Comments General comments (skin integrity, edema, etc.): No new concerns    Exercises     Assessment/Plan    PT Assessment Patient needs continued PT services  PT Problem List Decreased strength;Decreased mobility;Decreased safety awareness;Decreased activity tolerance;Decreased balance;Decreased knowledge of use of DME;Pain;Decreased knowledge of precautions;Decreased range of motion;Decreased coordination       PT Treatment Interventions DME instruction;Therapeutic activities;Gait training;Therapeutic exercise;Patient/family education;Balance training;Stair training;Functional mobility training;Neuromuscular re-education    PT Goals (Current goals can be found in the Care Plan section)  Acute Rehab PT Goals Patient Stated Goal: home PT Goal Formulation: With patient Time For Goal Achievement: 08/28/21 Potential to Achieve Goals: Good    Frequency Min 4X/week   Barriers to discharge        Co-evaluation               AM-PAC PT "6 Clicks" Mobility  Outcome Measure Help needed turning from your back to your side while in a flat bed without using bedrails?: A Little Help needed moving from lying on your back to sitting on the side of a flat bed without using bedrails?: A Little Help needed moving to and from a bed to a chair (including a wheelchair)?: A Little Help needed standing up from a chair using your arms (e.g., wheelchair or bedside chair)?: A Little Help needed to walk in hospital room?: A Lot Help needed climbing 3-5 steps with a railing? : A Lot 6 Click Score: 16    End of Session   Activity Tolerance: Patient tolerated treatment well Patient left: in  chair;with call bell/phone within reach;with chair alarm set Nurse Communication: Mobility status PT Visit Diagnosis: Other abnormalities of gait and mobility (R26.89);Pain Pain - Right/Left: Right Pain - part of body: Leg    Time: 8295-6213 PT Time Calculation (min) (ACUTE ONLY): 41 min   Charges:   PT Evaluation $PT Eval Low Complexity: 1 Low         Asher Babilonia S, PT DPT Acute Rehabilitation Services Pager [phone]  Office [phone]   Koichi Platte E Christain [place] 08/14/2021, 1:28 PM

## 2021-08-14 NOTE — Progress Notes (Addendum)
Orthopaedic Trauma Service Progress Note  Patient ID: Faith Stevens MRN: [id number] DOB/AGE: 09/27/1961 60 y.o.  Subjective:  Pain moderate but doing ok  Was able to go to the bathroom this am, + BM Asking lots of appropriate questions   No acute issues  Would like to dc home tomorrow    Does note that her L knee is her bad knee.  Will see how far she can mobilize using a walker, can use WC for longer distances   Review of Systems  Constitutional:  Negative for chills and fever.  Respiratory:  Negative for shortness of breath and wheezing.   Cardiovascular:  Negative for chest pain and palpitations.  Gastrointestinal:  Negative for abdominal pain, nausea and vomiting.  Genitourinary:  Negative for dysuria.  Neurological:  Negative for tingling and sensory change.  Psychiatric/Behavioral:  Negative for depression.   As above  Objective:   VITALS:   Vitals:   08/13/21 1233 08/13/21 1330 08/13/21 2000 08/14/21 0743  BP: 126/74 125/70 127/62 (!) 129/57  Pulse: 73 79 82 67  Resp:   15 17  Temp:   98.1 F (36.7 C) 98 F (36.7 C)  TempSrc:   Oral Oral  SpO2: 100% 100% 100% 99%  Weight:      Height:        Estimated body mass index is 34.33 kg/m as calculated from the following:   Height as of this encounter: 5\' 4"  (1.626 m).   Weight as of this encounter: 90.7 kg.   Intake/Output      08/14 0701 08/15 0700 08/15 0701 08/16 0700   I.V. (mL/kg) 400 (4.4)    IV Piggyback 100    Total Intake(mL/kg) 500 (5.5)    Urine (mL/kg/hr) 200 (0.1)    Blood 20    Total Output 220    Net +280           LABS  No results found for this or any previous visit (from the past 24 hour(s)).   PHYSICAL EXAM:   Gen: sitting up in bed, appears well, very pleasant  Lungs: unlabored Cardiac: Reg Ext:       Right Lower Extremity   Ex fix intact and stable  Bloody drainage on pinsite dressings but not  saturated  Compressive dressing intact   Ext warm   + DP pulse  No deep calf tenderness  Compartments are soft and nontender.  No pain out of proportion with passive stretching  Excellent active motion of her ankle.  Patient is able to actively extend beyond neutral  Ankle flexion, extension, inversion and eversion are intact  DPN, SPN, TN sensory functions intact  Assessment/Plan: 1 Day Post-Op   Principal Problem:   Closed bicondylar fracture of right tibial plateau Active Problems:   Essential hypertension   Anxiety   Anti-infectives (From admission, onward)    Start     Dose/Rate Route Frequency Ordered Stop   08/13/21 0800  ceFAZolin (ANCEF) IVPB 2g/100 mL premix        2 g 200 mL/hr over 30 Minutes Intravenous On call to O.R. 08/12/21 1014 08/13/21 1015     .  POD/HD#: 1  60 y/o female s/p fall off loading dock with comminuted R bicondylar tibial plateau fracture    -Fall off loading dock   -  R Bicondylar tibial plateau fracture s/p External fixation                NWB R leg    Ankle ROM as tolerated   Ankle theraband program and heel cord stretching    PT/OT        Will review pincare with pt and family tomorrow                  Ice and elevate R leg      Activity as tolerated while maintaining WB restrictions       Plan for office follow up 08/23/2021 and return to OR 08/24/2021   Think she will only require single approach based off CT scan.  There is a nondisplaced fracture along the posteriomedial plateau but not sure significant benefit could be had addressing this fragment     - Pain management:               Multimodal   - ABL anemia/Hemodynamics               Stable               Baseline hypertension controlled on home medications  - Medical issues                Hypertension                             Home medications     Elevated blood glucose        Will check A1c  - DVT/PE prophylaxis:               Lovenox    - ID:                 Perioperative antibiotics   - Metabolic Bone Disease:               Vitamin D levels look okay.  Continue with routine supplementation.               Mechanism was fairly high-energy however patient would benefit from having a bone density scan in the next 4 to 8 weeks as she is postmenopausal   - Activity:               Out of bed with assistance, nonweightbearing right leg   - FEN/GI prophylaxis/Foley/Lines:               Regular diet    NSL IV - Dispo:               therapies     DME    Home tomorrow   Mearl Latin, PA-C [phone] (C) 08/14/2021, 11:33 AM  [facility] [address] [place] [address] [phone] [phone] (F)    After 5pm and on the weekends please log on to Amion, go to orthopaedics and the look under the [facility] Call for the provider(s) on call. You can also call our office at [phone] and then follow the prompts to be connected to the call team.

## 2021-08-14 NOTE — Progress Notes (Addendum)
Occupational Therapy Treatment Patient Details Name: Faith Stevens MRN: [id number] DOB: March 11, 1961 Today's Date: 08/14/2021    History of present illness Faith Stevens is a 60 y.o. female who presented to [facility] after  falling 3 foot off of a loading dock while moving a box, ED work up showed Bicondylar tibial plateau fracture. Pt is s/p external fixator 8/14 with plans for delayed ORIF in 10 to 14 days. Patient has history of hypertension and is otherwise healthy   OT comments  [name] is progressing very well today, with better pain management and eager to participate with therapy. She completed bed mobility with min A for RLE, and SPT with RW given min A +2 for minimal physical assist and safety. Pt declined to progress further mobility at this time, +2 assist likely beneficial to progress distance. While sitting at the sink pt groomed with set up A and vc for WB status. Pt continues to benefit from continued OT acutely. D/c plan remains appropriate.    Follow Up Recommendations  Home health OT;Supervision/Assistance - 24 hour    Equipment Recommendations  3 in 1 bedside commode;Wheelchair (measurements OT)       Precautions / Restrictions Precautions Precautions: Fall Precaution Comments: ex fix Restrictions Weight Bearing Restrictions: Yes RLE Weight Bearing: Non weight bearing       Mobility Bed Mobility Overal bed mobility: Needs Assistance Bed Mobility: Supine to Sit Rolling: Min assist         General bed mobility comments: min A to manage RLE    Transfers Overall transfer level: Needs assistance Equipment used: Rolling walker (2 wheeled) Transfers: Sit to/from [employer] Sit to Stand: Min assist;+2 physical assistance;+2 safety/equipment Stand pivot transfers: Min assist;+2 physical assistance;+2 safety/equipment       General transfer comment: +2 this session for safety only, pt can progress with +1 for transfers. +2 helpful to progress  ambulation distances        ADL either performed or assessed with clinical judgement   ADL Overall ADL's : Needs assistance/impaired     Grooming: Sitting;Set up;Wash/dry face;Applying deodorant (hair wash with shower cap) Grooming Details (indicate cue type and reason): at the sink Upper Body Bathing: Set up;Sitting Upper Body Bathing Details (indicate cue type and reason): at the sink Lower Body Bathing: Set up;Sitting/lateral leans           Toilet Transfer: Minimal assistance;+2 for physical assistance;+2 for safety/equipment Toilet Transfer Details (indicate cue type and reason): simulated from bed>chair with RW given +2 A for physical assist to boost into standing and for safety         Functional mobility during ADLs: Minimal assistance;+2 for physical assistance;+2 for safety/equipment;Rolling walker;Cueing for safety;Cueing for sequencing General ADL Comments: +2 this session for pt safety, pt can progerss to +1 help only fro ADLs and transfers     Vision   Vision Assessment?: No apparent visual deficits    Cognition Arousal/Alertness: Awake/alert Behavior During Therapy: WFL for tasks assessed/performed Overall Cognitive Status: Within Functional Limits for tasks assessed                                                General Comments No new concerns    Pertinent Vitals/ Pain       Pain Assessment: Faces Faces Pain Scale: Hurts little more Pain Location: RLE, knee mostly Pain Descriptors /  Indicators: Discomfort;Grimacing Pain Intervention(s): Limited activity within patient's tolerance;Monitored during session   Frequency  Min 2X/week        Progress Toward Goals  OT Goals(current goals can now be found in the care plan section)  Progress towards OT goals: Progressing toward goals  Acute Rehab OT Goals Patient Stated Goal: home to shower OT Goal Formulation: With patient Time For Goal Achievement: 08/27/21 Potential to  Achieve Goals: Fair ADL Goals Pt Will Perform Lower Body Bathing: with supervision;sitting/lateral leans Pt Will Perform Lower Body Dressing: with min assist;with adaptive equipment;sitting/lateral leans Pt Will Transfer to Toilet: with min guard assist;stand pivot transfer;bedside commode Pt Will Perform Tub/Shower Transfer: with min guard assist;ambulating;shower seat;rolling walker  Plan Discharge plan remains appropriate    Co-evaluation                 AM-PAC OT "6 Clicks" Daily Activity     Outcome Measure   Help from another person eating meals?: None Help from another person taking care of personal grooming?: A Little Help from another person toileting, which includes using toliet, bedpan, or urinal?: A Lot Help from another person bathing (including washing, rinsing, drying)?: A Little Help from another person to put on and taking off regular upper body clothing?: A Little Help from another person to put on and taking off regular lower body clothing?: A Lot 6 Click Score: 17    End of Session Equipment Utilized During Treatment: Rolling walker  OT Visit Diagnosis: Other abnormalities of gait and mobility (R26.89);Muscle weakness (generalized) (M62.81);Pain   Activity Tolerance Patient tolerated treatment well   Patient Left in chair;with call bell/phone within reach;with family/visitor present;with chair alarm set   Nurse Communication Mobility status;Weight bearing status;Precautions        Time: 0321-2248 OT Time Calculation (min): 41 min  Charges: OT General Charges $OT Visit: 1 Visit OT Treatments $Self Care/Home Management : 23-37 mins    Mohamed Portlock A Wanona Stare 08/14/2021, 12:08 PM

## 2021-08-14 NOTE — Progress Notes (Signed)
Clinical impression: Faith Stevens was seen for splint check this session; foot plate still in great position, no redness or skin integrity issues present. Pt reports no increase in pain and tolerable experience of foot plate, she preferred to keep it on at the end of this session. Educated RN to remove foot plate at pt's request, and if any new concerns occur, remove splint and call OT rehab asap.     08/14/21 1700  OT Visit Information  Last OT Received On 08/14/21  Assistance Needed +1  History of Present Illness Faith Stevens is a 60 y.o. female who presented to [facility] after  falling 3 foot off of a loading dock while moving a box, ED work up showed Bicondylar tibial plateau fracture. Pt is s/p external fixator 8/14 with plans for delayed ORIF in 10 to 14 days. Patient has history of hypertension and is otherwise healthy  Precautions  Precautions Fall  Precaution Comments ex fix -  using exfix bars to lift LE as opposed to pulling on RLE itself  Required Braces or Orthoses Other Brace  Other Brace foot plate attached to ex fix  Pain Assessment  Pain Assessment 0-10  Pain Score 1  Pain Location RLE  Pain Descriptors / Indicators Discomfort  Pain Intervention(s) Limited activity within patient's tolerance;Monitored during session  Cognition  Arousal/Alertness Awake/alert  Behavior During Therapy WFL for tasks assessed/performed  Overall Cognitive Status Within Functional Limits for tasks assessed  General Comments  General comments (skin integrity, edema, etc.) Footplate splint check, no skin integrity issues notes. Per pt report, no incrased pain. Pt preference to continue to leave foot plate attached at the end of the session. Educated Charity fundraiser on how to remove plate, and how to don plate.  OT - End of Session  Activity Tolerance Patient tolerated treatment well  Patient left in bed;with call bell/phone within reach  Nurse Communication Other (comment) (splint care)  OT Assessment/Plan  OT Plan  Discharge plan remains appropriate  OT Visit Diagnosis Other abnormalities of gait and mobility (R26.89);Muscle weakness (generalized) (M62.81);Pain  Pain - Right/Left Right  Pain - part of body Leg  OT Frequency (ACUTE ONLY) Min 2X/week  Follow Up Recommendations Home health OT;Supervision/Assistance - 24 hour  OT Equipment 3 in 1 bedside commode;Wheelchair (measurements OT)  AM-PAC OT "6 Clicks" Daily Activity Outcome Measure (Version 2)  Help from another person eating meals? 4  Help from another person taking care of personal grooming? 3  Help from another person toileting, which includes using toliet, bedpan, or urinal? 2  Help from another person bathing (including washing, rinsing, drying)? 3  Help from another person to put on and taking off regular upper body clothing? 3  Help from another person to put on and taking off regular lower body clothing? 2  6 Click Score 17  Progressive Mobility  What is the highest level of mobility based on the progressive mobility assessment? Level 4 (Walks with assist in room) - Balance while marching in place and cannot step forward and back - Complete  Mobility Out of bed for toileting;Out of bed to chair with meals  OT Goal Progression  Progress towards OT goals Progressing toward goals  Acute Rehab OT Goals  Patient Stated Goal home  OT Goal Formulation With patient  Time For Goal Achievement 08/27/21  Potential to Achieve Goals Good  ADL Goals  Pt Will Perform Lower Body Bathing with supervision;sitting/lateral leans  Pt Will Perform Lower Body Dressing with min assist;with adaptive equipment;sitting/lateral leans  Pt Will Transfer to Toilet with min guard assist;stand pivot transfer;bedside commode  Pt Will Perform Tub/Shower Transfer with min guard assist;ambulating;shower seat;rolling walker  Additional ADL Goal #1 Pt will independently donn/doff footplate adn verbalize understanding of wear schedule  OT Time Calculation  OT Start Time  (ACUTE ONLY) 1656  OT Stop Time (ACUTE ONLY) 1705  OT Time Calculation (min) 9 min  OT General Charges  $OT Visit 1 Visit  OT Treatments  $Orthotics/Prosthetics Check 8-22 mins

## 2021-08-14 NOTE — Progress Notes (Addendum)
NCM called and spoke with pt's sister Serina Cowper per pt's request regarding workman's compensation case. Alisa informed NCM pt is from [place] and works for [employer] in [place] States pt was working the [employer] in [place] when fell and suffered tibial plateau fracture. Pt is s/p external fixator 8/14 with plans for delayed ORIF in 10 to 14 days. NCM explained to Alisa to notify pt's place of work( [employer] of pt's injury in order to process / obtain workmans compensation claim #. Serina Cowper stated she would f/u with pt's job. NCM called [employer] [phone]). Call unsuccessful, voice message left. TOC team will continue to monitor and follow for needs... Gae Gallop RN, [place] [phone]

## 2021-08-14 NOTE — Progress Notes (Signed)
Occupational Therapy Treatment Patient Details Name: Faith Stevens MRN: [id number] DOB: Jan 03, 1961 Today's Date: 08/14/2021    History of present illness Faith Stevens is a 60 y.o. female who presented to [facility] after  falling 3 foot off of a loading dock while moving a box, ED work up showed Bicondylar tibial plateau fracture. Pt is s/p external fixator 8/14 with plans for delayed ORIF in 10 to 14 days. Patient has history of hypertension and is otherwise healthy   OT comments  Pt seen for fabrication of R footplate. Pt to wear footplate when sleeping and as needed during daytime hours. Educated pt on importance of completing ankle pumps and heel cord stretches  throughout the day. Pt asking about removing ace wrap and taking a shower - ortho PA plans to discuss this in addition to pin care tomorrow. Will follow up this pm for splint check. Pt is hopeful for DC tomorrow.   Follow Up Recommendations  Home health OT;Supervision/Assistance - 24 hour    Equipment Recommendations  3 in 1 bedside commode;Wheelchair (measurements OT)    Recommendations for Other Services      Precautions / Restrictions Precautions Precautions: Fall Precaution Comments: ex fix -  using exfix bars to lift LE as opposed to pulling on RLE itself Restrictions Weight Bearing Restrictions: Yes RLE Weight Bearing: Non weight bearing                            Cognition Arousal/Alertness: Awake/alert Behavior During Therapy: WFL for tasks assessed/performed Overall Cognitive Status: Within Functional Limits for tasks assessed                                 General Comments: some anxiety with mobility        Exercises Exercises: Other exercises Other Exercises Other Exercises: heel cord stretch  - hold x 20 seconds; repeat 5 x/day   Shoulder Instructions       General Comments footplate fabricated. Splint fabricated to eliminate pressure from straps on foot. Padding added to  increase comfort and prevent slippage of splint. Pt sking quesionts regarding management of RLE during bathing. Educated on need to bath without bandages and then to complete pin care as indicated by PA. Educated on skin care after bathing. Educated pt again on picking up her leg by the ex fix bars rather than by her leg. Educated on importance of ice and elevation to reduce edema.          Pt verbalized understanding.   Pertinent Vitals/ Pain       Pain Assessment: 0-10 Pain Score: 5  Faces Pain Scale: Hurts little more Pain Location: RLE Pain Descriptors / Indicators: Discomfort;Grimacing Pain Intervention(s): Limited activity within patient's tolerance;Premedicated before session;Ice applied  Home Living Family/patient expects to be discharged to:: Private residence Living Arrangements: Spouse/significant other Available Help at Discharge: Family;Available 24 hours/day Type of Home: House Home Access: Stairs to enter [employer] of Steps: 5 Entrance Stairs-Rails: Right Home Layout: Two level;Able to live on main level with bedroom/bathroom     Bathroom Shower/Tub: Tub/shower unit;Walk-in shower   Bathroom Toilet: Standard Bathroom Accessibility: Yes   Home Equipment: None          Prior Functioning/Environment Level of Independence: Independent            Frequency  Min 2X/week        Progress Toward  Goals  OT Goals(current goals can now be found in the care plan section)  Progress towards OT goals: Progressing toward goals  Acute Rehab OT Goals Patient Stated Goal: home OT Goal Formulation: With patient Time For Goal Achievement: 08/27/21 Potential to Achieve Goals: Good ADL Goals Pt Will Perform Lower Body Bathing: with supervision;sitting/lateral leans Pt Will Perform Lower Body Dressing: with min assist;with adaptive equipment;sitting/lateral leans Pt Will Transfer to Toilet: with min guard assist;stand pivot transfer;bedside commode Pt Will  Perform Tub/Shower Transfer: with min guard assist;ambulating;shower seat;rolling walker Additional ADL Goal #1: Pt will independently donn/doff footplate adn verbalize understanding of wear schedule  Plan Discharge plan remains appropriate    Co-evaluation                 AM-PAC OT "6 Clicks" Daily Activity     Outcome Measure   Help from another person eating meals?: None Help from another person taking care of personal grooming?: A Little Help from another person toileting, which includes using toliet, bedpan, or urinal?: A Lot Help from another person bathing (including washing, rinsing, drying)?: A Little Help from another person to put on and taking off regular upper body clothing?: A Little Help from another person to put on and taking off regular lower body clothing?: A Lot 6 Click Score: 17    End of Session    OT Visit Diagnosis: Other abnormalities of gait and mobility (R26.89);Muscle weakness (generalized) (M62.81);Pain Pain - Right/Left: Right Pain - part of body: Leg   Activity Tolerance Patient tolerated treatment well   Patient Left in bed;with call bell/phone within reach   Nurse Communication Other (comment) (splint care)        Time: 4132-4401 OT Time Calculation (min): 38 min  Charges: OT General Charges $OT Visit: 1 Visit OT Treatments $Therapeutic Activity: 8-22 mins $Orthotics Fit/Training: 23-37 mins $ Splint materials basic: 1 Supply  Luisa Dago, OT/L   Acute OT Clinical Specialist Acute Rehabilitation Services Pager [phone] Office [phone]    [facility] 08/14/2021, 3:54 PM

## 2021-08-14 NOTE — TOC CAGE-AID Note (Signed)
Transition of Care [facility]) - CAGE-AID Screening   Patient Details  Name: Faith Stevens MRN: [id number] Date of Birth: 11/10/1961  Transition of Care [facility]) CM/SW Contact:    Lossie Faes Stevens, LCSWA Phone Number: 08/14/2021, 2:55 PM   Clinical Narrative:  Pt participated in Cage-Aid.  Pt stated she does not use substance or ETOH.  Pt was not offered resources, due to no usage of substance or ETOH.    Faith Stevens, MSW, LCSW-A Pronouns:  She/Her/Hers [facility]itions of Care Clinical Social Worker Direct Number:  [phone] [email or web address]   CAGE-AID Screening:    Have You Ever Felt You Ought to Cut Down on Your Drinking or Drug Use?: No Have People Annoyed You By [employer] Your Drinking Or Drug Use?: No Have You Felt Bad Or Guilty About Your Drinking Or Drug Use?: No Have You Ever Had a Drink or Used Drugs First Thing In The Morning to Steady Your Nerves or to Get Rid of a Hangover?: No CAGE-AID Score: 0  Substance Abuse Education Offered: No

## 2021-08-15 ENCOUNTER — Other Ambulatory Visit (HOSPITAL_COMMUNITY): Payer: Self-pay

## 2021-08-15 ENCOUNTER — Encounter (HOSPITAL_COMMUNITY): Payer: Self-pay | Admitting: Orthopedic Surgery

## 2021-08-15 LAB — CBC
HCT: 23.4 % — ABNORMAL LOW (ref 36.0–46.0)
Hemoglobin: 7.8 g/dL — ABNORMAL LOW (ref 12.0–15.0)
MCH: 31.1 pg (ref 26.0–34.0)
MCHC: 33.3 g/dL (ref 30.0–36.0)
MCV: 93.2 fL (ref 80.0–100.0)
Platelets: 171 10*3/uL (ref 150–400)
RBC: 2.51 MIL/uL — ABNORMAL LOW (ref 3.87–5.11)
RDW: 12.9 % (ref 11.5–15.5)
WBC: 7.1 10*3/uL (ref 4.0–10.5)
nRBC: 0 % (ref 0.0–0.2)

## 2021-08-15 LAB — HEMOGLOBIN A1C
Hgb A1c MFr Bld: 5.6 % (ref 4.8–5.6)
Mean Plasma Glucose: 114.02 mg/dL

## 2021-08-15 MED ORDER — METHOCARBAMOL 500 MG PO TABS
500.0000 mg | ORAL_TABLET | Freq: Four times a day (QID) | ORAL | 0 refills | Status: DC | PRN
  Filled 2021-08-15: qty 60, 8d supply, fill #0

## 2021-08-15 MED ORDER — ENOXAPARIN SODIUM 40 MG/0.4ML IJ SOSY
40.0000 mg | PREFILLED_SYRINGE | INTRAMUSCULAR | 0 refills | Status: DC
  Filled 2021-08-15: qty 4, 10d supply, fill #0

## 2021-08-15 MED ORDER — OXYCODONE-ACETAMINOPHEN 5-325 MG PO TABS
1.0000 | ORAL_TABLET | Freq: Four times a day (QID) | ORAL | 0 refills | Status: DC | PRN
  Filled 2021-08-15: qty 50, 7d supply, fill #0

## 2021-08-15 MED ORDER — ONDANSETRON 4 MG PO TBDP
4.0000 mg | ORAL_TABLET | Freq: Three times a day (TID) | ORAL | 0 refills | Status: DC | PRN
  Filled 2021-08-15: qty 20, 7d supply, fill #0

## 2021-08-15 MED ORDER — DOCUSATE SODIUM 100 MG PO CAPS
100.0000 mg | ORAL_CAPSULE | Freq: Two times a day (BID) | ORAL | 0 refills | Status: DC
  Filled 2021-08-15: qty 20, 10d supply, fill #0

## 2021-08-15 NOTE — Progress Notes (Signed)
Reviewed discharge paperwork. All questions answered. Awaiting [facility] arrival.

## 2021-08-15 NOTE — Progress Notes (Signed)
Bladder scan not done because patient refused. Patient states she does not need it because she is urinating fine.

## 2021-08-15 NOTE — Progress Notes (Signed)
[facility] here to take patient.

## 2021-08-15 NOTE — Progress Notes (Signed)
Pt had some blood on pillow underneath LRL. Notifed Dr. . Dr. Had  said it was probably due to draining around pins to apply ABD underneath limb and wrap with ace bandage as needed. Reva Bores. 08/15/21. 3:38 AM

## 2021-08-15 NOTE — Progress Notes (Signed)
Pt refused bladder scanner, said there was no pain or dullness noted. Pt has voided 800cc of yellow/amber urine Reva Bores 08/15/21 7:05 AM

## 2021-08-15 NOTE — Progress Notes (Signed)
Orthopaedic Trauma Service Progress Note  Patient ID: Faith Stevens MRN: [id number] DOB/AGE: 58-Jan-1962 57 y.o.  Subjective:  Doing well Pain tolerable Wants to go home today   ROS As above  Objective:   VITALS:   Vitals:   08/13/21 2000 08/14/21 0743 08/14/21 2134 08/15/21 0600  BP: 127/62 (!) 129/57 134/60 (!) 146/63  Pulse: 82 67 69 75  Resp: 15 17 18 18   Temp: 98.1 F (36.7 C) 98 F (36.7 C) 98.2 F (36.8 C) 98.2 F (36.8 C)  TempSrc: Oral Oral Oral Oral  SpO2: 100% 99% 95% 94%  Weight:      Height:        Estimated body mass index is 34.33 kg/m as calculated from the following:   Height as of this encounter: 5\' 4"  (1.626 m).   Weight as of this encounter: 90.7 kg.   Intake/Output      08/15 0701 08/16 0700 08/16 0701 08/17 0700   I.V. (mL/kg) 1200 (13.2)    IV Piggyback     Total Intake(mL/kg) 1200 (13.2)    Urine (mL/kg/hr) 800 (0.4)    Blood     Total Output 800    Net +400           LABS  Results for orders placed or performed during the hospital encounter of 08/11/21 (from the past 24 hour(s))  CBC     Status: Abnormal   Collection Time: 08/15/21  7:16 AM  Result Value Ref Range   WBC 7.1 4.0 - 10.5 K/uL   RBC 2.51 (L) 3.87 - 5.11 MIL/uL   Hemoglobin 7.8 (L) 12.0 - 15.0 g/dL   HCT 10/11/21 (L) 08/17/21 - 17.5 %   MCV 93.2 80.0 - 100.0 fL   MCH 31.1 26.0 - 34.0 pg   MCHC 33.3 30.0 - 36.0 g/dL   RDW 10.2 58.5 - 27.7 %   Platelets 171 150 - 400 K/uL   nRBC 0.0 0.0 - 0.2 %  Hemoglobin A1c     Status: None   Collection Time: 08/15/21  7:16 AM  Result Value Ref Range   Hgb A1c MFr Bld 5.6 4.8 - 5.6 %   Mean Plasma Glucose 114.02 mg/dL     PHYSICAL EXAM:   Gen: sitting up in bed, appears well, very pleasant  Lungs: unlabored Cardiac: Reg Ext:       Right Lower Extremity              Ex fix intact and stable             All dressings changed.  Swelling is  improving.  Blisters are stable.  New dressings applied to pin sites.             Ext warm              + DP pulse             No deep calf tenderness             Compartments are soft and nontender.  No pain out of proportion with passive stretching             Excellent active motion of her ankle.  Patient is able to actively extend beyond neutral  Ankle flexion, extension, inversion and eversion are intact             DPN, SPN, TN sensory functions intact  Assessment/Plan: 2 Days Post-Op   Principal Problem:   Closed bicondylar fracture of right tibial plateau Active Problems:   Essential hypertension   Anxiety   Anti-infectives (From admission, onward)    Start     Dose/Rate Route Frequency Ordered Stop   08/13/21 0800  ceFAZolin (ANCEF) IVPB 2g/100 mL premix        2 g 200 mL/hr over 30 Minutes Intravenous On call to O.R. 08/12/21 1014 08/13/21 1015     .  POD/HD#: 2  60 y/o female s/p fall off loading dock with comminuted R bicondylar tibial plateau fracture    -Fall off loading dock   - R Bicondylar tibial plateau fracture s/p External fixation                NWB R leg               Ankle ROM as tolerated                         Ankle theraband program and heel cord stretching               PT/OT                              dressing changed and reviewed [facility] with pt and family                             Ice and elevate R leg                  Activity as tolerated while maintaining WB restrictions                   Plan for office follow up 08/23/2021 and return to OR 08/24/2021                         Think she will only require single approach based off CT scan.  There is a nondisplaced fracture along the posteriomedial plateau but not sure significant benefit could be had addressing this fragment      - Pain management:               Multimodal  Home with robaxin, percocet, ok to use NSAIDs sparsely    - ABL anemia/Hemodynamics                Stable               Baseline hypertension controlled on home medications   - Medical issues                Hypertension                             Home medications                 Elevated blood glucose                              a1c looks good    - DVT/PE prophylaxis:  Lovenox   Continue lovenox at dc as well   - ID:                Perioperative antibiotics   - Metabolic Bone Disease:               Vitamin D levels look okay.  Continue with routine supplementation.               Mechanism was fairly high-energy however patient would benefit from having a bone density scan in the next 4 to 8 weeks as she is postmenopausal   - Activity:               Out of bed with assistance, nonweightbearing right leg   - FEN/GI prophylaxis/Foley/Lines:               Regular diet               NSL IV - Dispo:               therapies                DME               Home today   Follow up with ortho 08/23/2021, return to OR 08/24/2021    Mearl Latin, PA-C [phone] (C) 08/15/2021, 12:01 PM  [facility] [address] [place] [address] [phone] [phone] (F)    After 5pm and on the weekends please log on to Amion, go to orthopaedics and the look under the [facility] Call for the provider(s) on call. You can also call our office at [phone] and then follow the prompts to be connected to the call team.

## 2021-08-15 NOTE — Discharge Instructions (Addendum)
Orthopaedic Trauma Service Discharge Instructions   General Discharge Instructions  Orthopaedic Injuries:  Right tibial plateau fracture treated with external fixation.  Plan for definitive fixation in 10 to 14 days  WEIGHT BEARING STATUS: Nonweightbearing right leg.  Use walker or wheelchair to mobilize  RANGE OF MOTION/ACTIVITY: Unrestricted range of motion of ankle and toes.  Continue with exercises that therapy taught shoe in the hospital.  Continue to use footplate to help maintain ankle motion  Bone health: Vitamin D levels look really good.  Continue to take 5000 IUs of vitamin D3 daily  Wound Care: Daily pin care as we reviewed in the hospital.  Please see instructions below   Discharge Pin Site Instructions  Dress pins daily with Kerlix roll starting on POD 2. Wrap the Kerlix so that it tamps the skin down around the pin-skin interface to prevent/limit motion of the skin relative to the pin.  (Pin-skin motion is the primary cause of pain and infection related to external fixator pin sites).  Remove any crust or coagulum that may obstruct drainage with soap and water.  After POD 3, if there is no discernable drainage on the pin site dressing, the interval for change can by increased to every other day.  You may shower with the fixator, cleaning all pin sites gently with soap and water.  If you have a surgical wound this needs to be completely dry and without drainage before showering. Alternatively you can use a washcloth with soap and water and gently clean the injured extremity and external fixator, including all pinsites and surgical wounds   The extremity can be lifted by the fixator to facilitate wound care and transfers.  Notify the office/Doctor if you experience increasing drainage, redness, or pain from a pin site, or if you notice purulent (thick, snot-like) drainage. As we discussed pin tract infections are common in this is most likely as a result of mechanical  irritation from the skin pin interface. Primary treatment is hygiene and cleaning with soap and water. If this does not resolve with regular cleaning contact the office  DVT/PE prophylaxis: Lovenox one subcutaneous injection daily for the next 10 days  Diet: as you were eating previously.  Can use over the counter stool softeners and bowel preparations, such as Miralax, to help with bowel movements.  Narcotics can be constipating.  Be sure to drink plenty of fluids  PAIN MEDICATION USE AND EXPECTATIONS  You have likely been given narcotic medications to help control your pain.  After a traumatic event that results in an fracture (broken bone) with or without surgery, it is ok to use narcotic pain medications to help control one's pain.  We understand that everyone responds to pain differently and each individual patient will be evaluated on a regular basis for the continued need for narcotic medications. Ideally, narcotic medication use should last no more than 6-8 weeks (coinciding with fracture healing).   As a patient it is your responsibility as well to monitor narcotic medication use and report the amount and frequency you use these medications when you come to your office visit.   We would also advise that if you are using narcotic medications, you should take a dose prior to therapy to maximize you participation.  IF YOU ARE ON NARCOTIC MEDICATIONS IT IS NOT PERMISSIBLE TO OPERATE A MOTOR VEHICLE (MOTORCYCLE/CAR/TRUCK/MOPED) OR HEAVY MACHINERY DO NOT MIX NARCOTICS WITH OTHER CNS (CENTRAL NERVOUS SYSTEM) DEPRESSANTS SUCH AS ALCOHOL   POST-OPERATIVE OPIOID TAPER INSTRUCTIONS: It  is important to wean off of your opioid medication as soon as possible. If you do not need pain medication after your surgery it is ok to stop day one. Opioids include: Codeine, Hydrocodone(Norco, Vicodin), Oxycodone(Percocet, oxycontin) and hydromorphone amongst others.  Long term and even short term use of opiods  can cause: Increased pain response Dependence Constipation Depression Respiratory depression And more.  Withdrawal symptoms can include Flu like symptoms Nausea, vomiting And more Techniques to manage these symptoms Hydrate well Eat regular healthy meals Stay active Use relaxation techniques(deep breathing, meditating, yoga) Do Not substitute Alcohol to help with tapering If you have been on opioids for less than two weeks and do not have pain than it is ok to stop all together.  Plan to wean off of opioids This plan should start within one week post op of your fracture surgery  Maintain the same interval or time between taking each dose and first decrease the dose.  Cut the total daily intake of opioids by one tablet each day Next start to increase the time between doses. The last dose that should be eliminated is the evening dose.    STOP SMOKING OR USING NICOTINE PRODUCTS!!!!  As discussed nicotine severely impairs your body's ability to heal surgical and traumatic wounds but also impairs bone healing.  Wounds and bone heal by forming microscopic blood vessels (angiogenesis) and nicotine is a vasoconstrictor (essentially, shrinks blood vessels).  Therefore, if vasoconstriction occurs to these microscopic blood vessels they essentially disappear and are unable to deliver necessary nutrients to the healing tissue.  This is one modifiable factor that you can do to dramatically increase your chances of healing your injury.    (This means no smoking, no nicotine gum, patches, etc)  DO NOT USE NONSTEROIDAL ANTI-INFLAMMATORY DRUGS (NSAID'S)  Using products such as Advil (ibuprofen), Aleve (naproxen), Motrin (ibuprofen) for additional pain control during fracture healing can delay and/or prevent the healing response.  If you would like to take over the counter (OTC) medication, Tylenol (acetaminophen) is ok.  However, some narcotic medications that are given for pain control contain  acetaminophen as well. Therefore, you should not exceed more than 4000 mg of tylenol in a day if you do not have liver disease.  Also note that there are may OTC medicines, such as cold medicines and allergy medicines that my contain tylenol as well.  If you have any questions about medications and/or interactions please ask your doctor/PA or your pharmacist.      ICE AND ELEVATE INJURED/OPERATIVE EXTREMITY  Using ice and elevating the injured extremity above your heart can help with swelling and pain control.  Icing in a pulsatile fashion, such as 20 minutes on and 20 minutes off, can be followed.    Do not place ice directly on skin. Make sure there is a barrier between to skin and the ice pack.    Using frozen items such as frozen peas works well as the conform nicely to the are that needs to be iced.  USE AN ACE WRAP OR TED HOSE FOR SWELLING CONTROL  In addition to icing and elevation, Ace wraps or TED hose are used to help limit and resolve swelling.  It is recommended to use Ace wraps or TED hose until you are informed to stop.    When using Ace Wraps start the wrapping distally (farthest away from the body) and wrap proximally (closer to the body)   Example: If you had surgery on your leg or thing  and you do not have a splint on, start the ace wrap at the toes and work your way up to the thigh        If you had surgery on your upper extremity and do not have a splint on, start the ace wrap at your fingers and work your way up to the upper arm  IF YOU ARE IN A SPLINT OR CAST DO NOT REMOVE IT FOR ANY REASON   If your splint gets wet for any reason please contact the office immediately. You may shower in your splint or cast as long as you keep it dry.  This can be done by wrapping in a cast cover or garbage back (or similar)  Do Not stick any thing down your splint or cast such as pencils, money, or hangers to try and scratch yourself with.  If you feel itchy take benadryl as prescribed on the  bottle for itching  IF YOU ARE IN A CAM BOOT (BLACK BOOT)  You may remove boot periodically. Perform daily dressing changes as noted below.  Wash the liner of the boot regularly and wear a sock when wearing the boot. It is recommended that you sleep in the boot until told otherwise    Call office for the following: Temperature greater than 101F Persistent nausea and vomiting Severe uncontrolled pain Redness, tenderness, or signs of infection (pain, swelling, redness, odor or green/yellow discharge around the site) Difficulty breathing, headache or visual disturbances Hives Persistent dizziness or light-headedness Extreme fatigue Any other questions or concerns you may have after discharge  In an emergency, call 911 or go to an Emergency Department at a nearby hospital  HELPFUL INFORMATION  If you had a block, it will wear off between 8-24 hrs postop typically.  This is period when your pain may go from nearly zero to the pain you would have had postop without the block.  This is an abrupt transition but nothing dangerous is happening.  You may take an extra dose of narcotic when this happens.  You should wean off your narcotic medicines as soon as you are able.  Most patients will be off or using minimal narcotics before their first postop appointment.   We suggest you use the pain medication the first night prior to going to bed, in order to ease any pain when the anesthesia wears off. You should avoid taking pain medications on an empty stomach as it will make you nauseous.  Do not drink alcoholic beverages or take illicit drugs when taking pain medications.  In most states it is against the law to drive while you are in a splint or sling.  And certainly against the law to drive while taking narcotics.  You may return to work/school in the next couple of days when you feel up to it.   Pain medication may make you constipated.  Below are a few solutions to try in this  order: Decrease the amount of pain medication if you aren't having pain. Drink lots of decaffeinated fluids. Drink prune juice and/or each dried prunes  If the first 3 don't work start with additional solutions Take Colace - an over-the-counter stool softener Take Senokot - an over-the-counter laxative Take Miralax - a stronger over-the-counter laxative     CALL THE OFFICE WITH ANY QUESTIONS OR CONCERNS: [phone]   VISIT OUR WEBSITE FOR ADDITIONAL INFORMATION: [email or web address]

## 2021-08-15 NOTE — Progress Notes (Signed)
PT Cancellation Note  Patient Details Name: Faith Stevens MRN: [id number] DOB: 03/05/1961   Cancelled Treatment:    Reason Eval/Treat Not Completed: (P) Other (comment);Patient declined, no reason specified (Pt denies need for stair training and reports she will have ambulance transport her home today as her supporting limb cannot hold her to negotiate stairs.)   Eugean Arnott Artis Delay 08/15/2021, 1:24 PM  Bonney Leitz , PTA Acute Rehabilitation Services Pager [phone] Office [phone]

## 2021-08-15 NOTE — Progress Notes (Signed)
08/15/21 1700  OT Visit Information  Last OT Received On 08/15/21  Assistance Needed +1  History of Present Illness Faith Stevens is a 60 y.o. female who presented to [facility] after  falling 3 foot off of a loading dock while moving a box, ED work up showed Bicondylar tibial plateau fracture. Pt is s/p external fixator 8/14 with plans for delayed ORIF in 10 to 14 days. Patient has history of hypertension and is otherwise healthy  Precautions  Precautions Fall  Precaution Comments ex fix -  using exfix bars to lift LE as opposed to pulling on RLE itself  Required Braces or Orthoses Other Brace  Other Brace foot plate attached to ex fix  Pain Assessment  Pain Assessment 0-10  Pain Score 1  Pain Location RLE  Pain Descriptors / Indicators Discomfort  Pain Intervention(s) Limited activity within patient's tolerance  Cognition  Arousal/Alertness Awake/alert  Behavior During Therapy WFL for tasks assessed/performed  Overall Cognitive Status Within Functional Limits for tasks assessed  General Comments some anxiety with discharge planning  Upper Extremity Assessment  Upper Extremity Assessment Overall WFL for tasks assessed  Lower Extremity Assessment  Lower Extremity Assessment Defer to PT evaluation  ADL  General ADL Comments session focused on further education of foot plate, rental/temorary ramp resources and safe home set up. Pt and pt's assistant present and verbalized understanding of all education  Restrictions  Weight Bearing Restrictions Yes  RLE Weight Bearing NWB  General Comments  General comments (skin integrity, edema, etc.) Footplate was well tolerated overnight, no issues noted. Pt's assistant was educated on the application of footplate and pt's wear schedule. Pt brought up concern of getting in/out her house next week for pre-op appointment. Pt states her "good" knee is not so good and she is unable to hop up/down steps. Therapist provided handout resources and ramp guides  for rental companies with explaintion of possible option to have a temporary ramp placed to have wc accessability in/out of her home. Pt appreciative and understanding.  OT - End of Session  Activity Tolerance Patient tolerated treatment well  Patient left in bed;with call bell/phone within reach  OT Assessment/Plan  OT Plan Discharge plan remains appropriate  OT Visit Diagnosis Other abnormalities of gait and mobility (R26.89);Muscle weakness (generalized) (M62.81);Pain  Pain - Right/Left Right  Pain - part of body Leg  OT Frequency (ACUTE ONLY) Min 2X/week  Follow Up Recommendations Home health OT;Supervision/Assistance - 24 hour  OT Equipment 3 in 1 bedside commode;Wheelchair (measurements OT)  AM-PAC OT "6 Clicks" Daily Activity Outcome Measure (Version 2)  Help from another person eating meals? 4  Help from another person taking care of personal grooming? 3  Help from another person toileting, which includes using toliet, bedpan, or urinal? 2  Help from another person bathing (including washing, rinsing, drying)? 3  Help from another person to put on and taking off regular upper body clothing? 3  Help from another person to put on and taking off regular lower body clothing? 2  6 Click Score 17  OT Goal Progression  Progress towards OT goals Progressing toward goals  Acute Rehab OT Goals  Patient Stated Goal home  OT Goal Formulation With patient  Time For Goal Achievement 08/27/21  Potential to Achieve Goals Good  ADL Goals  Pt Will Perform Lower Body Bathing with supervision;sitting/lateral leans  Pt Will Perform Lower Body Dressing with min assist;with adaptive equipment;sitting/lateral leans  Pt Will Transfer to Toilet with min guard assist;stand  pivot transfer;bedside commode  Pt Will Perform Tub/Shower Transfer with min guard assist;ambulating;shower seat;rolling walker  Additional ADL Goal #1 Pt will independently donn/doff footplate adn verbalize understanding of wear  schedule  OT Time Calculation  OT Start Time (ACUTE ONLY) 1620  OT Stop Time (ACUTE ONLY) 1640  OT Time Calculation (min) 20 min  OT General Charges  $OT Visit 1 Visit  OT Treatments  $Self Care/Home Management  8-22 mins

## 2021-08-15 NOTE — Discharge Summary (Signed)
Orthopaedic Trauma Service (OTS) Discharge Summary   Patient ID: Faith Stevens MRN: [id number] DOB/AGE: 03-Jul-1961 60 y.o.  Admit date: 08/11/2021 Discharge date: 08/15/2021  Admission Diagnoses: Closed bicondylar tibial plateau fracture  Hypertension Anxiety   Discharge Diagnoses:  Principal Problem:   Closed bicondylar fracture of right tibial plateau Active Problems:   Essential hypertension   Anxiety   Past Medical History:  Diagnosis Date   Anxiety 08/14/2021   Essential hypertension 08/14/2021   HTN (hypertension) with goal to be determined      Procedures Performed: 08/13/2021- Dr. Carola Frost 1. CLOSED REDUCTION OF RIGHT TIBIAL PLATEAU 2. APPLICATION OF SPANNING EXTERNAL FIXATOR RIGHT KNEE    Discharged Condition: good  Hospital Course:  Patient is a very pleasant 60 year old female who sustained an injury while working on 08/11/2021.  Patient fell off a loading dock landing on her right leg.  Had immediate onset of pain in her right knee.  She was brought to [facility] where she was found to have a complex right tibial plateau fracture.  She was subsequently transferred to [facility] for definitive treatment and evaluation.  She was seen and evaluated by the orthopedic trauma service on 08/12/2021.  We then took her to the OR on 08/13/2021 for application of a spanning external fixator as her swelling was too severe to allow for safe surgical intervention with definitive ORIF.  Patient's hospital stay was uncomplicated.  She was started with therapies on postoperative day #1 and did mobilize fairly well.  She was started on Lovenox for DVT and PE prophylaxis.  Pain was controlled with IV Dilaudid and oral oxycodone as well as oral Tylenol.  She also use muscle relaxers (Robaxin) with good effect.  We did check her vitamin D levels which were appropriate levels.  She had no other major issues during hospital stay.  She continued to progress well over  the next 24 hours and was deemed stable for discharge on postoperative day #2.  Prior to discharge I did review wound care with the patient including pin site care.  All questions were encouraged and answered.  Patient discharged in stable condition on 08/15/2021.  We will plan on seeing her back in the office on 08/23/2021 with anticipated return to the operating room for definitive fixation on 08/24/2021.  Patient will remain on Lovenox until her next surgery.  We will likely then transition her to a DOAC after her definitive surgery.  Consults: None  Significant Diagnostic Studies: labs:  Results for NEDRA, MCINNIS (MRN [id number]) as of 08/15/2021 12:12  Ref. Range 08/11/2021 20:40 08/12/2021 01:42 08/15/2021 07:16  BASIC METABOLIC PANEL Unknown Rpt (A)    Sodium Latest Ref Range: 135 - 145 mmol/L 136    Potassium Latest Ref Range: 3.5 - 5.1 mmol/L 3.7    Chloride Latest Ref Range: 98 - 111 mmol/L 101    CO2 Latest Ref Range: 22 - 32 mmol/L 26    Glucose Latest Ref Range: 70 - 99 mg/dL 725 (H)    Mean Plasma Glucose Latest Units: mg/dL   366.44  BUN Latest Ref Range: 6 - 20 mg/dL 26 (H)    Creatinine Latest Ref Range: 0.44 - 1.00 mg/dL 0.34    Calcium Latest Ref Range: 8.9 - 10.3 mg/dL 8.8 (L)    Anion gap Latest Ref Range: 5 - 15  9    GFR, Estimated Latest Ref Range: >60 mL/min >60    Vitamin D, 25-Hydroxy Latest Ref Range: 30 -  100 ng/mL  39.61   WBC Latest Ref Range: 4.0 - 10.5 K/uL 13.6 (H) 11.2 (H) 7.1  RBC Latest Ref Range: 3.87 - 5.11 MIL/uL 3.70 (L) 3.37 (L) 2.51 (L)  Hemoglobin Latest Ref Range: 12.0 - 15.0 g/dL 42.5 (L) 95.6 (L) 7.8 (L)  HCT Latest Ref Range: 36.0 - 46.0 % 33.7 (L) 31.2 (L) 23.4 (L)  MCV Latest Ref Range: 80.0 - 100.0 fL 91.1 92.6 93.2  MCH Latest Ref Range: 26.0 - 34.0 pg 30.8 30.9 31.1  MCHC Latest Ref Range: 30.0 - 36.0 g/dL 38.7 56.4 33.2  RDW Latest Ref Range: 11.5 - 15.5 % 12.5 12.5 12.9  Platelets Latest Ref Range: 150 - 400 K/uL 234 210 171  nRBC Latest  Ref Range: 0.0 - 0.2 % 0.0 0.0 0.0  Neutrophils Latest Units: % 81    Lymphocytes Latest Units: % 13    Monocytes Relative Latest Units: % 6    Eosinophil Latest Units: % 0    Basophil Latest Units: % 0    Immature Granulocytes Latest Units: % 0    NEUT# Latest Ref Range: 1.7 - 7.7 K/uL 11.1 (H)    Lymphocyte # Latest Ref Range: 0.7 - 4.0 K/uL 1.7    Monocyte # Latest Ref Range: 0.1 - 1.0 K/uL 0.8    Eosinophils Absolute Latest Ref Range: 0.0 - 0.5 K/uL 0.0    Basophils Absolute Latest Ref Range: 0.0 - 0.1 K/uL 0.1    Abs Immature Granulocytes Latest Ref Range: 0.00 - 0.07 K/uL 0.03    Prothrombin Time Latest Ref Range: 11.4 - 15.2 seconds 13.1    INR Latest Ref Range: 0.8 - 1.2  1.0    Hemoglobin A1C Latest Ref Range: 4.8 - 5.6 %   5.6    Treatments: IV hydration, antibiotics: Ancef, analgesia: acetaminophen, Dilaudid, and oxycodone, anticoagulation: LMW heparin, therapies: PT, OT, and RN, and surgery: as above  Discharge Exam:        Orthopaedic Trauma Service Progress Note   Patient ID: Faith Stevens MRN: [id number] DOB/AGE: 01/26/1961 60 y.o.   Subjective:   Doing well Pain tolerable Wants to go home today    ROS As above   Objective:    VITALS:         Vitals:    08/13/21 2000 08/14/21 0743 08/14/21 2134 08/15/21 0600  BP: 127/62 (!) 129/57 134/60 (!) 146/63  Pulse: 82 67 69 75  Resp: 15 17 18 18   Temp: 98.1 F (36.7 C) 98 F (36.7 C) 98.2 F (36.8 C) 98.2 F (36.8 C)  TempSrc: Oral Oral Oral Oral  SpO2: 100% 99% 95% 94%  Weight:          Height:              Estimated body mass index is 34.33 kg/m as calculated from the following:   Height as of this encounter: 5\' 4"  (1.626 m).   Weight as of this encounter: 90.7 kg.     Intake/Output      08/15 0701 08/16 0700 08/16 0701 08/17 0700   I.V. (mL/kg) 1200 (13.2)    IV Piggyback     Total Intake(mL/kg) 1200 (13.2)    Urine (mL/kg/hr) 800 (0.4)    Blood     Total Output 800    Net +400             LABS   Lab Results Last 24 Hours       Results for orders placed  or performed during the hospital encounter of 08/11/21 (from the past 24 hour(s))  CBC     Status: Abnormal    Collection Time: 08/15/21  7:16 AM  Result Value Ref Range    WBC 7.1 4.0 - 10.5 K/uL    RBC 2.51 (L) 3.87 - 5.11 MIL/uL    Hemoglobin 7.8 (L) 12.0 - 15.0 g/dL    HCT 16.1 (L) 09.6 - 46.0 %    MCV 93.2 80.0 - 100.0 fL    MCH 31.1 26.0 - 34.0 pg    MCHC 33.3 30.0 - 36.0 g/dL    RDW 04.5 40.9 - 81.1 %    Platelets 171 150 - 400 K/uL    nRBC 0.0 0.0 - 0.2 %  Hemoglobin A1c     Status: None    Collection Time: 08/15/21  7:16 AM  Result Value Ref Range    Hgb A1c MFr Bld 5.6 4.8 - 5.6 %    Mean Plasma Glucose 114.02 mg/dL          PHYSICAL EXAM:    Gen: sitting up in bed, appears well, very pleasant  Lungs: unlabored Cardiac: Reg Ext:       Right Lower Extremity              Ex fix intact and stable             All dressings changed.  Swelling is improving.  Blisters are stable.  New dressings applied to pin sites.             Ext warm              + DP pulse             No deep calf tenderness             Compartments are soft and nontender.  No pain out of proportion with passive stretching             Excellent active motion of her ankle.  Patient is able to actively extend beyond neutral             Ankle flexion, extension, inversion and eversion are intact             DPN, SPN, TN sensory functions intact   Assessment/Plan: 2 Days Post-Op    Principal Problem:   Closed bicondylar fracture of right tibial plateau Active Problems:   Essential hypertension   Anxiety     Anti-infectives (From admission, onward)        Start     Dose/Rate Route Frequency Ordered Stop    08/13/21 0800   ceFAZolin (ANCEF) IVPB 2g/100 mL premix        2 g 200 mL/hr over 30 Minutes Intravenous On call to O.R. 08/12/21 1014 08/13/21 1015         .   POD/HD#: 2   60 y/o female s/p fall off  loading dock with comminuted R bicondylar tibial plateau fracture    -Fall off loading dock   - R Bicondylar tibial plateau fracture s/p External fixation                NWB R leg               Ankle ROM as tolerated                         Ankle theraband program and heel cord stretching  PT/OT                              dressing changed and reviewed [facility] with pt and family                             Ice and elevate R leg                  Activity as tolerated while maintaining WB restrictions                   Plan for office follow up 08/23/2021 and return to OR 08/24/2021                         Think she will only require single approach based off CT scan.  There is a nondisplaced fracture along the posteriomedial plateau but not sure significant benefit could be had addressing this fragment      - Pain management:               Multimodal             Home with robaxin, percocet, ok to use NSAIDs sparsely    - ABL anemia/Hemodynamics               Stable               Baseline hypertension controlled on home medications   - Medical issues                Hypertension                             Home medications                 Elevated blood glucose                              a1c looks good    - DVT/PE prophylaxis:               Lovenox              Continue lovenox at dc as well   - ID:                Perioperative antibiotics   - Metabolic Bone Disease:               Vitamin D levels look okay.  Continue with routine supplementation.               Mechanism was fairly high-energy however patient would benefit from having a bone density scan in the next 4 to 8 weeks as she is postmenopausal   - Activity:               Out of bed with assistance, nonweightbearing right leg   - FEN/GI prophylaxis/Foley/Lines:               Regular diet               NSL IV - Dispo:               therapies                DME  Home today               Follow up with ortho 08/23/2021, return to OR 08/24/2021      Disposition: Discharge disposition: 01-Home or Self Care       Discharge Instructions     Call MD / Call 911   Complete by: As directed    If you experience chest pain or shortness of breath, CALL 911 and be transported to the hospital emergency room.  If you develope a fever above 101 F, pus (white drainage) or increased drainage or redness at the wound, or calf pain, call your surgeon's office.   Constipation Prevention   Complete by: As directed    Drink plenty of fluids.  Prune juice may be helpful.  You may use a stool softener, such as Colace (over the counter) 100 mg twice a day.  Use MiraLax (over the counter) for constipation as needed.   Diet general   Complete by: As directed    Discharge instructions   Complete by: As directed    Orthopaedic Trauma Service Discharge Instructions   General Discharge Instructions  Orthopaedic Injuries:  Right tibial plateau fracture treated with external fixation.  Plan for definitive fixation in 10 to 14 days  WEIGHT BEARING STATUS: Nonweightbearing right leg.  Use walker or wheelchair to mobilize  RANGE OF MOTION/ACTIVITY: Unrestricted range of motion of ankle and toes.  Continue with exercises that therapy taught shoe in the hospital.  Continue to use footplate to help maintain ankle motion  Bone health: Vitamin D levels look really good.  Continue to take 5000 IUs of vitamin D3 daily  Wound Care: Daily pin care as we reviewed in the hospital.  Please see instructions below   Discharge Pin Site Instructions  Dress pins daily with Kerlix roll starting on POD 2. Wrap the Kerlix so that it tamps the skin down around the pin-skin interface to prevent/limit motion of the skin relative to the pin.  (Pin-skin motion is the primary cause of pain and infection related to external fixator pin sites).  Remove any crust or coagulum that may obstruct drainage with soap and  water.  After POD 3, if there is no discernable drainage on the pin site dressing, the interval for change can by increased to every other day.  You may shower with the fixator, cleaning all pin sites gently with soap and water.  If you have a surgical wound this needs to be completely dry and without drainage before showering. Alternatively you can use a washcloth with soap and water and gently clean the injured extremity and external fixator, including all pinsites and surgical wounds   The extremity can be lifted by the fixator to facilitate wound care and transfers.  Notify the office/Doctor if you experience increasing drainage, redness, or pain from a pin site, or if you notice purulent (thick, snot-like) drainage. As we discussed pin tract infections are common in this is most likely as a result of mechanical irritation from the skin pin interface. Primary treatment is hygiene and cleaning with soap and water. If this does not resolve with regular cleaning contact the office  DVT/PE prophylaxis: Lovenox one subcutaneous injection daily for the next 10 days  Diet: as you were eating previously.  Can use over the counter stool softeners and bowel preparations, such as Miralax, to help with bowel movements.  Narcotics can be constipating.  Be sure to drink plenty of fluids  PAIN MEDICATION USE AND EXPECTATIONS  You have likely been given narcotic medications to help control your pain.  After a traumatic event that results in an fracture (broken bone) with or without surgery, it is ok to use narcotic pain medications to help control one's pain.  We understand that everyone responds to pain differently and each individual patient will be evaluated on a regular basis for the continued need for narcotic medications. Ideally, narcotic medication use should last no more than 6-8 weeks (coinciding with fracture healing).   As a patient it is your responsibility as well to monitor narcotic medication use  and report the amount and frequency you use these medications when you come to your office visit.   We would also advise that if you are using narcotic medications, you should take a dose prior to therapy to maximize you participation.  IF YOU ARE ON NARCOTIC MEDICATIONS IT IS NOT PERMISSIBLE TO OPERATE A MOTOR VEHICLE (MOTORCYCLE/CAR/TRUCK/MOPED) OR HEAVY MACHINERY DO NOT MIX NARCOTICS WITH OTHER CNS (CENTRAL NERVOUS SYSTEM) DEPRESSANTS SUCH AS ALCOHOL   POST-OPERATIVE OPIOID TAPER INSTRUCTIONS:  It is important to wean off of your opioid medication as soon as possible. If you do not need pain medication after your surgery it is ok to stop day one.  Opioids include:  o Codeine, Hydrocodone(Norco, Vicodin), Oxycodone(Percocet, oxycontin) and hydromorphone amongst others.   Long term and even short term use of opiods can cause:  o Increased pain response  o Dependence  o Constipation  o Depression  o Respiratory depression  o And more.   Withdrawal symptoms can include  o Flu like symptoms  o Nausea, vomiting  o And more  Techniques to manage these symptoms  o Hydrate well  o Eat regular healthy meals  o Stay active  o Use relaxation techniques(deep breathing, meditating, yoga)  Do Not substitute Alcohol to help with tapering  If you have been on opioids for less than two weeks and do not have pain than it is ok to stop all together.   Plan to wean off of opioids  o This plan should start within one week post op of your fracture surgery   o Maintain the same interval or time between taking each dose and first decrease the dose.   o Cut the total daily intake of opioids by one tablet each day  o Next start to increase the time between doses.  o The last dose that should be eliminated is the evening dose.    STOP SMOKING OR USING NICOTINE PRODUCTS!!!!  As discussed nicotine severely impairs your body's ability to heal surgical and traumatic wounds but also impairs bone  healing.  Wounds and bone heal by forming microscopic blood vessels (angiogenesis) and nicotine is a vasoconstrictor (essentially, shrinks blood vessels).  Therefore, if vasoconstriction occurs to these microscopic blood vessels they essentially disappear and are unable to deliver necessary nutrients to the healing tissue.  This is one modifiable factor that you can do to dramatically increase your chances of healing your injury.    (This means no smoking, no nicotine gum, patches, etc)  DO NOT USE NONSTEROIDAL ANTI-INFLAMMATORY DRUGS (NSAID'S)  Using products such as Advil (ibuprofen), Aleve (naproxen), Motrin (ibuprofen) for additional pain control during fracture healing can delay and/or prevent the healing response.  If you would like to take over the counter (OTC) medication, Tylenol (acetaminophen) is ok.  However, some narcotic medications that are given for pain control contain acetaminophen as well. Therefore, you should not exceed more than 4000  mg of tylenol in a day if you do not have liver disease.  Also note that there are may OTC medicines, such as cold medicines and allergy medicines that my contain tylenol as well.  If you have any questions about medications and/or interactions please ask your doctor/PA or your pharmacist.      ICE AND ELEVATE INJURED/OPERATIVE EXTREMITY  Using ice and elevating the injured extremity above your heart can help with swelling and pain control.  Icing in a pulsatile fashion, such as 20 minutes on and 20 minutes off, can be followed.    Do not place ice directly on skin. Make sure there is a barrier between to skin and the ice pack.    Using frozen items such as frozen peas works well as the conform nicely to the are that needs to be iced.  USE AN ACE WRAP OR TED HOSE FOR SWELLING CONTROL  In addition to icing and elevation, Ace wraps or TED hose are used to help limit and resolve swelling.  It is recommended to use Ace wraps or TED hose until you are  informed to stop.    When using Ace Wraps start the wrapping distally (farthest away from the body) and wrap proximally (closer to the body)   Example: If you had surgery on your leg or thing and you do not have a splint on, start the ace wrap at the toes and work your way up to the thigh        If you had surgery on your upper extremity and do not have a splint on, start the ace wrap at your fingers and work your way up to the upper arm  IF YOU ARE IN A SPLINT OR CAST DO NOT REMOVE IT FOR ANY REASON   If your splint gets wet for any reason please contact the office immediately. You may shower in your splint or cast as long as you keep it dry.  This can be done by wrapping in a cast cover or garbage back (or similar)  Do Not stick any thing down your splint or cast such as pencils, money, or hangers to try and scratch yourself with.  If you feel itchy take benadryl as prescribed on the bottle for itching  IF YOU ARE IN A CAM BOOT (BLACK BOOT)  You may remove boot periodically. Perform daily dressing changes as noted below.  Wash the liner of the boot regularly and wear a sock when wearing the boot. It is recommended that you sleep in the boot until told otherwise    Call office for the following: ? Temperature greater than 101F ? Persistent nausea and vomiting ? Severe uncontrolled pain ? Redness, tenderness, or signs of infection (pain, swelling, redness, odor or green/yellow discharge around the site) ? Difficulty breathing, headache or visual disturbances ? Hives ? Persistent dizziness or light-headedness ? Extreme fatigue ? Any other questions or concerns you may have after discharge  In an emergency, call 911 or go to an Emergency Department at a nearby hospital  HELPFUL INFORMATION  ? If you had a block, it will wear off between 8-24 hrs postop typically.  This is period when your pain may go from nearly zero to the pain you would have had postop without the block.  This is an  abrupt transition but nothing dangerous is happening.  You may take an extra dose of narcotic when this happens.  ? You should wean off your narcotic medicines as soon  as you are able.  Most patients will be off or using minimal narcotics before their first postop appointment.   ? We suggest you use the pain medication the first night prior to going to bed, in order to ease any pain when the anesthesia wears off. You should avoid taking pain medications on an empty stomach as it will make you nauseous.  ? Do not drink alcoholic beverages or take illicit drugs when taking pain medications.  ? In most states it is against the law to drive while you are in a splint or sling.  And certainly against the law to drive while taking narcotics.  ? You may return to work/school in the next couple of days when you feel up to it.   ? Pain medication may make you constipated.  Below are a few solutions to try in this order:   ? Decrease the amount of pain medication if you aren't having pain.   ? Drink lots of decaffeinated fluids.   ? Drink prune juice and/or each dried prunes   o If the first 3 don't work start with additional solutions   ? Take Colace - an over-the-counter stool softener   ? Take Senokot - an over-the-counter laxative   ? Take Miralax - a stronger over-the-counter laxative     CALL THE OFFICE WITH ANY QUESTIONS OR CONCERNS: [phone]   VISIT OUR WEBSITE FOR ADDITIONAL INFORMATION: [email or web address]   Driving restrictions   Complete by: As directed    No driving   Increase activity slowly as tolerated   Complete by: As directed    Non weight bearing   Complete by: As directed    Laterality: right   Extremity: Lower   Post-operative opioid taper instructions:   Complete by: As directed    POST-OPERATIVE OPIOID TAPER INSTRUCTIONS: It is important to wean off of your opioid medication as soon as possible. If you do not need pain medication after your surgery it is ok to  stop day one. Opioids include: Codeine, Hydrocodone(Norco, Vicodin), Oxycodone(Percocet, oxycontin) and hydromorphone amongst others.  Long term and even short term use of opiods can cause: Increased pain response Dependence Constipation Depression Respiratory depression And more.  Withdrawal symptoms can include Flu like symptoms Nausea, vomiting And more Techniques to manage these symptoms Hydrate well Eat regular healthy meals Stay active Use relaxation techniques(deep breathing, meditating, yoga) Do Not substitute Alcohol to help with tapering If you have been on opioids for less than two weeks and do not have pain than it is ok to stop all together.  Plan to wean off of opioids This plan should start within one week post op of your joint replacement. Maintain the same interval or time between taking each dose and first decrease the dose.  Cut the total daily intake of opioids by one tablet each day Next start to increase the time between doses. The last dose that should be eliminated is the evening dose.         Allergies as of 08/15/2021   No Known Allergies      Medication List     TAKE these medications    ALPRAZolam 1 MG tablet Commonly known as: XANAX Take 0.5 mg by mouth daily as needed for anxiety.   Bystolic 20 MG Tabs Generic drug: Nebivolol HCl Take 20 mg by mouth every evening.   docusate sodium 100 MG capsule Commonly known as: COLACE Take 1 capsule (100 mg total) by mouth 2 (two) times daily.  enoxaparin 40 MG/0.4ML injection Commonly known as: LOVENOX Inject 0.4 mLs (40 mg total) into the skin daily for 10 days.   methocarbamol 500 MG tablet Commonly known as: ROBAXIN Take 1-2 tablets (500-1,000 mg total) by mouth every 6 (six) hours as needed for muscle spasms.   olmesartan 20 MG tablet Commonly known as: BENICAR Take 20 mg by mouth daily.   ondansetron 4 MG disintegrating tablet Commonly known as: Zofran ODT Take 1 tablet (4 mg  total) by mouth every 8 (eight) hours as needed for nausea or vomiting.   oxyCODONE-acetaminophen 5-325 MG tablet Commonly known as: Percocet Take 1-2 tablets by mouth every 6 (six) hours as needed for severe pain.   VITAMIN B-12 PO Take 1 tablet by mouth daily.   VITAMIN D3 PO Take 1 tablet by mouth daily.               Durable Medical Equipment  (From admission, onward)           Start     Ordered   08/14/21 1203  For home use only DME Walker rolling  Once       Question Answer Comment  Walker: With 5 Inch Wheels   Patient needs a walker to treat with the following condition Closed bicondylar fracture of right tibial plateau      08/14/21 1204   08/14/21 1203  For home use only DME 3 n 1  Once        08/14/21 1204   08/14/21 1203  For home use only DME standard manual wheelchair with seat cushion  Once       Comments: Patient suffers from Right bicondylar tibial plateau fracture which impairs their ability to perform daily activities like bathing, dressing, feeding, and grooming in the home.  A cane or crutch will not resolve issue with performing activities of daily living. A wheelchair will allow patient to safely perform daily activities. Patient can safely propel the wheelchair in the home or has a caregiver who can provide assistance. Length of need 6 months . Accessories: elevating leg rests (ELRs), wheel locks, extensions and anti-tippers.   08/14/21 1204              Discharge Care Instructions  (From admission, onward)           Start     Ordered   08/15/21 0000  Non weight bearing       Question Answer Comment  Laterality right   Extremity Lower      08/15/21 1224            Follow-up Information     Myrene Galas, MD. Schedule an appointment as soon as possible for a visit on 08/23/2021.   Specialty: Orthopedic Surgery Contact information: [address] [place] [address] [phone]                 Discharge  Instructions and Plan:  60 y/o female s/p fall off loading dock with comminuted R bicondylar tibial plateau fracture   Weightbearing: NWB RLE Insicional and dressing care: Daily dressing changes with Kerlix and Ace.  Daily pin care.  Okay to shower and clean pin sites with soap and water Orthopedic device(s):  Wheelchair and walker Showering: Okay to shower with fixator on.  Remove all dressings and let the soap and water run over pin sites and leg.  Gently clean pin sites with soap and water.  Redress with kerlix and Ace wrap after leg is  dry VTE prophylaxis: Lovenox  qd  x 10 days Pain control: multimodal: robaxin, percocet, tylenol Bone Health/Optimization: daily vitamin d, recommend vitamin d3 5000 IUs daily  Follow - up plan:  08/23/2021 Contact information:  Myrene Galas MD, Montez Morita PA-C   Signed:  Mearl Latin, PA-C [phone] (C) 08/15/2021, 12:25 PM  [facility] [address] [place] [address] [phone] Collier Bullock (F)

## 2021-08-15 NOTE — TOC Transition Note (Addendum)
Transition of Care [facility]) - CM/SW Discharge Note   Patient Details  Name: Faith Stevens MRN: [id number] Date of Birth: Aug 03, 1961  Transition of Care [facility]) CM/SW Contact:  Epifanio Lesches, RN Phone Number: 08/15/2021, 11:25 AM   Clinical Narrative:    Patient will DC to: home Anticipated DC date: 08/15/2021 Family notified: yes, Midwife by: Sharin Mons  Presented after  falling 3 foot off of a loading dock while moving a box. States currently residing in [place]  ( [address] , [place], [place] [address] ) for flea/furniture market..Suffered R tibial plateau fracture. Pt is s/p external fixator 8/14 with plans for delayed ORIF in 10 to 14 days.  Per MD patient ready for DC today.   NCM spoke with Pt and pt's assistant regarding TOC needs. NCM was informed by pt case would not be filed under [employer].Pt states has contacted her insurance company ([employer] [place] [phone]), and states her insurance can be used for this hospital visit 2/2 case being emergent.   Per PT recommendation: home health PT : supervision/assistance. Unable to secure home health agency 2/2 to pt's insurance and no agency to accept private pay, PA made aware.  PA ok with pt d/c without home health services.  Order noted for DME: RW, and 3 in1/BSC.Marland KitchenMarland KitchenReferral made with [facility]. Eqipment will be delivered to bedside prior to d/c  Pt without Rx med concerns. TOC pharmacy will deliver Rx meds to bedside prior to d/c.   Transportation forms on chart. Ambulance transport requested for patient.   RNCM will sign off for now as intervention is no longer needed. Please consult [place] again if new needs arise.    Final next level of care: Home/Self Care Barriers to Discharge: No Barriers Identified   Patient Goals and CMS Choice     Choice offered to / list presented to : Patient  Discharge Placement                       Discharge Plan and Services     Post Acute Care  Choice: (P) Durable Medical Equipment          DME Arranged: 3-N-1, Walker rolling, Wheelchair manual (self pay/self employed; not workmanscomp.case) DME Agency: [facility] Date DME Agency Contacted: 08/15/21 Time DME Agency Contacted: 1125 Representative spoke with at DME Agency: Velna Hatchet            Social Determinants of Health (SDOH) Interventions     Readmission Risk Interventions No flowsheet data found.

## 2021-08-15 NOTE — Progress Notes (Signed)
Patients IV was removed. Montez Morita PA-C gave the order.

## 2021-08-16 NOTE — Anesthesia Postprocedure Evaluation (Signed)
Anesthesia Post Note  Patient: Faith Stevens  Procedure(s) Performed: EXTERNAL FIXATION LEG (Right: Leg Lower)     Patient location during evaluation: PACU Anesthesia Type: General Level of consciousness: awake and alert Pain management: pain level controlled Vital Signs Assessment: post-procedure vital signs reviewed and stable Respiratory status: spontaneous breathing, nonlabored ventilation, respiratory function stable and patient connected to nasal cannula oxygen Cardiovascular status: blood pressure returned to baseline and stable Postop Assessment: no apparent nausea or vomiting Anesthetic complications: no   No notable events documented.  Last Vitals:  Vitals:   08/15/21 1351 08/15/21 2222  BP: 134/64 (!) 131/58  Pulse: 73 74  Resp: 17 18  Temp: 36.9 C 36.9 C  SpO2: 93% 96%    Last Pain:  Vitals:   08/15/21 2222  TempSrc: Oral  PainSc: 0-No pain                 Brigett Estell

## 2021-08-23 ENCOUNTER — Encounter (HOSPITAL_COMMUNITY): Payer: Self-pay | Admitting: Orthopedic Surgery

## 2021-08-23 NOTE — Anesthesia Preprocedure Evaluation (Addendum)
Anesthesia Evaluation  Patient identified by MRN, date of birth, ID band Patient awake    Reviewed: Allergy & Precautions, NPO status , Patient's Chart, lab work & pertinent test results, reviewed documented beta blocker date and time   History of Anesthesia Complications Negative for: history of anesthetic complications  Airway Mallampati: II  TM Distance: >3 FB Neck ROM: Full    Dental no notable dental hx. (+) Dental Advisory Given   Pulmonary neg pulmonary ROS,  Covid-19 Nucleic Acid Test Results Lab Results      Component                Value               Date                      SARSCOV2NAA              NEGATIVE            08/11/2021              Pulmonary exam normal        Cardiovascular hypertension, Pt. on home beta blockers Normal cardiovascular exam     Neuro/Psych PSYCHIATRIC DISORDERS Anxiety negative neurological ROS     GI/Hepatic negative GI ROS, Neg liver ROS,   Endo/Other  negative endocrine ROS  Renal/GU negative Renal ROS     Musculoskeletal  right bicondylar tibial plateau fracture   Abdominal   Peds  Hematology negative hematology ROS (+)   Anesthesia Other Findings   Reproductive/Obstetrics                            Anesthesia Physical  Anesthesia Plan  ASA: 2  Anesthesia Plan: General   Post-op Pain Management:    Induction: Intravenous  PONV Risk Score and Plan: 4 or greater and Ondansetron, Dexamethasone, Midazolam and Scopolamine patch - Pre-op  Airway Management Planned: Oral ETT  Additional Equipment: None  Intra-op Plan:   Post-operative Plan: Extubation in OR  Informed Consent: I have reviewed the patients History and Physical, chart, labs and discussed the procedure including the risks, benefits and alternatives for the proposed anesthesia with the patient or authorized representative who has indicated his/her understanding and  acceptance.     Dental advisory given  Plan Discussed with: Anesthesiologist  Anesthesia Plan Comments:        Anesthesia Quick Evaluation

## 2021-08-23 NOTE — Progress Notes (Signed)
DUE TO COVID-19 ONLY ONE VISITOR IS ALLOWED TO COME WITH YOU AND STAY IN THE WAITING ROOM ONLY DURING PRE OP AND PROCEDURE DAY OF SURGERY.   Two  VISITORS  MAY VISIT WITH YOU AFTER SURGERY IN YOUR PRIVATE ROOM DURING VISITING HOURS ONLY!  PCP - none Cardiologist - n/a  Chest x-ray - n/a EKG - n/a Stress Test - n/a ECHO - n/a Cardiac Cath - n/a  Sleep Study -  n/a CPAP - none  Anticoagulant Instructions:  Follow your surgeon's instructions on Lovenox prior to surgery.  STOP now taking any Aspirin (unless otherwise instructed by your surgeon), Aleve, Naproxen, Ibuprofen, Motrin, Advil, Goody's, BC's, all herbal medications, fish oil, and all vitamins.   Coronavirus Screening Covid test is scheduled on DOS Do you have any of the following symptoms:  Cough yes/no: No Fever (>100.8F)  yes/no: No Runny nose yes/no: No Sore throat yes/no: No Difficulty breathing/shortness of breath  yes/no: No  Have you traveled in the last 14 days and where? yes/no: No  Patient verbalized understanding of instructions that were given via phone.

## 2021-08-24 ENCOUNTER — Inpatient Hospital Stay (HOSPITAL_COMMUNITY)
Admission: RE | Admit: 2021-08-24 | Discharge: 2021-08-28 | DRG: 493 | Disposition: A | Discharge status: Home Health | Payer: 59 | Attending: Orthopedic Surgery | Admitting: Orthopedic Surgery

## 2021-08-24 ENCOUNTER — Inpatient Hospital Stay (HOSPITAL_COMMUNITY): Payer: 59

## 2021-08-24 ENCOUNTER — Other Ambulatory Visit (HOSPITAL_COMMUNITY): Payer: Self-pay

## 2021-08-24 ENCOUNTER — Encounter (HOSPITAL_COMMUNITY): Payer: Self-pay | Admitting: Orthopedic Surgery

## 2021-08-24 ENCOUNTER — Encounter (HOSPITAL_COMMUNITY)
Admission: RE | Disposition: A | Discharge status: Home Health | Payer: Self-pay | Source: Home / Self Care | Attending: Orthopedic Surgery

## 2021-08-24 ENCOUNTER — Other Ambulatory Visit: Payer: Self-pay

## 2021-08-24 ENCOUNTER — Inpatient Hospital Stay (HOSPITAL_COMMUNITY): Payer: 59 | Admitting: Certified Registered"

## 2021-08-24 DIAGNOSIS — Y9389 Activity, other specified: Secondary | ICD-10-CM

## 2021-08-24 DIAGNOSIS — Y9289 Other specified places as the place of occurrence of the external cause: Secondary | ICD-10-CM

## 2021-08-24 DIAGNOSIS — D62 Acute posthemorrhagic anemia: Secondary | ICD-10-CM | POA: Diagnosis not present

## 2021-08-24 DIAGNOSIS — S82141A Displaced bicondylar fracture of right tibia, initial encounter for closed fracture: Secondary | ICD-10-CM | POA: Diagnosis present

## 2021-08-24 DIAGNOSIS — Z20822 Contact with and (suspected) exposure to covid-19: Secondary | ICD-10-CM | POA: Diagnosis present

## 2021-08-24 DIAGNOSIS — T148XXA Other injury of unspecified body region, initial encounter: Secondary | ICD-10-CM

## 2021-08-24 DIAGNOSIS — F419 Anxiety disorder, unspecified: Secondary | ICD-10-CM | POA: Diagnosis present

## 2021-08-24 DIAGNOSIS — W1789XA Other fall from one level to another, initial encounter: Secondary | ICD-10-CM | POA: Diagnosis present

## 2021-08-24 DIAGNOSIS — I10 Essential (primary) hypertension: Secondary | ICD-10-CM | POA: Diagnosis present

## 2021-08-24 DIAGNOSIS — Z419 Encounter for procedure for purposes other than remedying health state, unspecified: Secondary | ICD-10-CM

## 2021-08-24 HISTORY — PX: ORIF TIBIA PLATEAU: SHX2132

## 2021-08-24 HISTORY — PX: EXTERNAL FIXATION REMOVAL: SHX5040

## 2021-08-24 LAB — PROTIME-INR
INR: 1 (ref 0.8–1.2)
Prothrombin Time: 13.1 seconds (ref 11.4–15.2)

## 2021-08-24 LAB — COMPREHENSIVE METABOLIC PANEL
ALT: 39 U/L (ref 0–44)
AST: 21 U/L (ref 15–41)
Albumin: 3.9 g/dL (ref 3.5–5.0)
Alkaline Phosphatase: 77 U/L (ref 38–126)
Anion gap: 10 (ref 5–15)
BUN: 17 mg/dL (ref 6–20)
CO2: 25 mmol/L (ref 22–32)
Calcium: 9.3 mg/dL (ref 8.9–10.3)
Chloride: 100 mmol/L (ref 98–111)
Creatinine, Ser: 0.62 mg/dL (ref 0.44–1.00)
GFR, Estimated: >60 mL/min (ref >60)
Glucose, Bld: 116 mg/dL — ABNORMAL HIGH (ref 70–99)
Potassium: 3.3 mmol/L — ABNORMAL LOW (ref 3.5–5.1)
Sodium: 135 mmol/L (ref 135–145)
Total Bilirubin: 0.9 mg/dL (ref 0.3–1.2)
Total Protein: 6.9 g/dL (ref 6.5–8.1)

## 2021-08-24 LAB — TYPE AND SCREEN
ABO/RH(D): O POS
Antibody Screen: NEGATIVE

## 2021-08-24 LAB — CBC WITH DIFFERENTIAL/PLATELET
Abs Immature Granulocytes: 0.02 10*3/uL (ref 0.00–0.07)
Basophils Absolute: 0.1 10*3/uL (ref 0.0–0.1)
Basophils Relative: 1 %
Eosinophils Absolute: 0.1 10*3/uL (ref 0.0–0.5)
Eosinophils Relative: 1 %
HCT: 31.1 % — ABNORMAL LOW (ref 36.0–46.0)
Hemoglobin: 9.9 g/dL — ABNORMAL LOW (ref 12.0–15.0)
Immature Granulocytes: 0 %
Lymphocytes Relative: 20 %
Lymphs Abs: 1.3 10*3/uL (ref 0.7–4.0)
MCH: 30.9 pg (ref 26.0–34.0)
MCHC: 31.8 g/dL (ref 30.0–36.0)
MCV: 97.2 fL (ref 80.0–100.0)
Monocytes Absolute: 0.5 10*3/uL (ref 0.1–1.0)
Monocytes Relative: 7 %
Neutro Abs: 4.6 10*3/uL (ref 1.7–7.7)
Neutrophils Relative %: 71 %
Platelets: 374 10*3/uL (ref 150–400)
RBC: 3.2 MIL/uL — ABNORMAL LOW (ref 3.87–5.11)
RDW: 15.3 % (ref 11.5–15.5)
WBC: 6.5 10*3/uL (ref 4.0–10.5)
nRBC: 0 % (ref 0.0–0.2)

## 2021-08-24 LAB — SARS CORONAVIRUS 2 BY RT PCR (HOSPITAL ORDER, PERFORMED IN ~~LOC~~ HOSPITAL LAB): SARS Coronavirus 2: NEGATIVE

## 2021-08-24 LAB — APTT: aPTT: 28 seconds (ref 24–36)

## 2021-08-24 LAB — ABO/RH: ABO/RH(D): O POS

## 2021-08-24 SURGERY — OPEN REDUCTION INTERNAL FIXATION (ORIF) TIBIAL PLATEAU
Anesthesia: General | Laterality: Right

## 2021-08-24 MED ORDER — ONDANSETRON HCL 4 MG/2ML IJ SOLN
INTRAMUSCULAR | Status: AC
  Filled 2021-08-24: qty 2

## 2021-08-24 MED ORDER — SUGAMMADEX SODIUM 200 MG/2ML IV SOLN
INTRAVENOUS | Status: DC | PRN
  Administered 2021-08-24: 200 mg via INTRAVENOUS

## 2021-08-24 MED ORDER — FENTANYL CITRATE (PF) 100 MCG/2ML IJ SOLN
INTRAMUSCULAR | Status: AC
  Filled 2021-08-24: qty 2

## 2021-08-24 MED ORDER — METOCLOPRAMIDE HCL 5 MG PO TABS: 5.0000 mg | ORAL_TABLET | Freq: Three times a day (TID) | ORAL | Status: DC | PRN

## 2021-08-24 MED ORDER — MIDAZOLAM HCL 5 MG/5ML IJ SOLN
INTRAMUSCULAR | Status: DC | PRN
  Administered 2021-08-24: 2 mg via INTRAVENOUS

## 2021-08-24 MED ORDER — ONDANSETRON HCL 4 MG PO TABS: 4.0000 mg | ORAL_TABLET | Freq: Four times a day (QID) | ORAL | Status: DC | PRN

## 2021-08-24 MED ORDER — ONDANSETRON HCL 4 MG/2ML IJ SOLN: 4.0000 mg | Freq: Four times a day (QID) | INTRAMUSCULAR | Status: DC | PRN

## 2021-08-24 MED ORDER — SODIUM CHLORIDE 0.9 % IV SOLN
INTRAVENOUS | Status: DC | PRN
  Administered 2021-08-24: 30 ug/min via INTRAVENOUS

## 2021-08-24 MED ORDER — PROPOFOL 10 MG/ML IV BOLUS
INTRAVENOUS | Status: AC
  Filled 2021-08-24: qty 20

## 2021-08-24 MED ORDER — NEBIVOLOL HCL 10 MG PO TABS
20.0000 mg | ORAL_TABLET | Freq: Every evening | ORAL | Status: DC
  Administered 2021-08-24 – 2021-08-26 (×3): 20 mg via ORAL
  Filled 2021-08-24 (×4): qty 2

## 2021-08-24 MED ORDER — OXYCODONE HCL 5 MG PO TABS
10.0000 mg | ORAL_TABLET | ORAL | Status: DC | PRN
  Administered 2021-08-24: 10 mg via ORAL
  Administered 2021-08-27: 15 mg via ORAL
  Filled 2021-08-24 (×2): qty 3

## 2021-08-24 MED ORDER — HYDROMORPHONE HCL 1 MG/ML IJ SOLN
0.5000 mg | INTRAMUSCULAR | Status: DC | PRN
  Administered 2021-08-24 – 2021-08-26 (×5): 1 mg via INTRAVENOUS
  Filled 2021-08-24 (×6): qty 1

## 2021-08-24 MED ORDER — ACETAMINOPHEN 500 MG PO TABS
1000.0000 mg | ORAL_TABLET | Freq: Once | ORAL | Status: AC
  Administered 2021-08-24: 1000 mg via ORAL
  Filled 2021-08-24: qty 2

## 2021-08-24 MED ORDER — CELECOXIB 200 MG PO CAPS
200.0000 mg | ORAL_CAPSULE | Freq: Once | ORAL | Status: AC
  Administered 2021-08-24: 200 mg via ORAL
  Filled 2021-08-24: qty 1

## 2021-08-24 MED ORDER — CEFAZOLIN SODIUM-DEXTROSE 2-4 GM/100ML-% IV SOLN
2.0000 g | Freq: Four times a day (QID) | INTRAVENOUS | Status: AC
  Administered 2021-08-24 – 2021-08-25 (×3): 2 g via INTRAVENOUS
  Filled 2021-08-24 (×4): qty 100

## 2021-08-24 MED ORDER — POTASSIUM CHLORIDE IN NACL 20-0.9 MEQ/L-% IV SOLN
INTRAVENOUS | Status: DC
  Filled 2021-08-24 (×8): qty 1000

## 2021-08-24 MED ORDER — DEXTROSE 5 % IV SOLN
500.0000 mg | Freq: Three times a day (TID) | INTRAVENOUS | Status: DC
  Filled 2021-08-24: qty 5

## 2021-08-24 MED ORDER — ACETAMINOPHEN 325 MG PO TABS: 325.0000 mg | ORAL_TABLET | Freq: Four times a day (QID) | ORAL | Status: DC | PRN

## 2021-08-24 MED ORDER — ALPRAZOLAM 0.5 MG PO TABS
0.5000 mg | ORAL_TABLET | Freq: Every day | ORAL | Status: DC | PRN
  Administered 2021-08-24 – 2021-08-28 (×5): 0.5 mg via ORAL
  Filled 2021-08-24 (×5): qty 1

## 2021-08-24 MED ORDER — FENTANYL CITRATE (PF) 100 MCG/2ML IJ SOLN
INTRAMUSCULAR | Status: AC
  Administered 2021-08-24: 50 ug via INTRAVENOUS
  Filled 2021-08-24: qty 2

## 2021-08-24 MED ORDER — VITAMIN D 25 MCG (1000 UNIT) PO TABS
1000.0000 [IU] | ORAL_TABLET | Freq: Every day | ORAL | Status: DC
  Administered 2021-08-24 – 2021-08-28 (×5): 1000 [IU] via ORAL
  Filled 2021-08-24 (×7): qty 1

## 2021-08-24 MED ORDER — AMISULPRIDE (ANTIEMETIC) 5 MG/2ML IV SOLN: 10.0000 mg | Freq: Once | INTRAVENOUS | Status: DC | PRN

## 2021-08-24 MED ORDER — FENTANYL CITRATE (PF) 100 MCG/2ML IJ SOLN
INTRAMUSCULAR | Status: DC | PRN
  Administered 2021-08-24: 50 ug via INTRAVENOUS
  Administered 2021-08-24: 100 ug via INTRAVENOUS
  Administered 2021-08-24 (×2): 50 ug via INTRAVENOUS

## 2021-08-24 MED ORDER — HYDROMORPHONE HCL 1 MG/ML IJ SOLN
INTRAMUSCULAR | Status: AC
  Filled 2021-08-24: qty 0.5

## 2021-08-24 MED ORDER — DOCUSATE SODIUM 100 MG PO CAPS
100.0000 mg | ORAL_CAPSULE | Freq: Two times a day (BID) | ORAL | Status: DC
  Administered 2021-08-24 – 2021-08-28 (×9): 100 mg via ORAL
  Filled 2021-08-24 (×9): qty 1

## 2021-08-24 MED ORDER — PROMETHAZINE HCL 25 MG/ML IJ SOLN
INTRAMUSCULAR | Status: AC
  Filled 2021-08-24: qty 1

## 2021-08-24 MED ORDER — FENTANYL CITRATE (PF) 100 MCG/2ML IJ SOLN
25.0000 ug | INTRAMUSCULAR | Status: DC | PRN
  Administered 2021-08-24 (×3): 25 ug via INTRAVENOUS

## 2021-08-24 MED ORDER — FENTANYL CITRATE (PF) 250 MCG/5ML IJ SOLN
INTRAMUSCULAR | Status: AC
  Filled 2021-08-24: qty 5

## 2021-08-24 MED ORDER — ACETAMINOPHEN 500 MG PO TABS
1000.0000 mg | ORAL_TABLET | Freq: Three times a day (TID) | ORAL | Status: DC
  Administered 2021-08-24 – 2021-08-28 (×12): 1000 mg via ORAL
  Filled 2021-08-24 (×12): qty 2

## 2021-08-24 MED ORDER — LIDOCAINE 2% (20 MG/ML) 5 ML SYRINGE
INTRAMUSCULAR | Status: DC | PRN
  Administered 2021-08-24: 100 mg via INTRAVENOUS

## 2021-08-24 MED ORDER — ROCURONIUM BROMIDE 10 MG/ML (PF) SYRINGE
PREFILLED_SYRINGE | INTRAVENOUS | Status: AC
  Filled 2021-08-24: qty 10

## 2021-08-24 MED ORDER — LACTATED RINGERS IV SOLN: INTRAVENOUS | Status: DC

## 2021-08-24 MED ORDER — DEXAMETHASONE SODIUM PHOSPHATE 10 MG/ML IJ SOLN
INTRAMUSCULAR | Status: AC
  Filled 2021-08-24: qty 1

## 2021-08-24 MED ORDER — 0.9 % SODIUM CHLORIDE (POUR BTL) OPTIME
TOPICAL | Status: DC | PRN
  Administered 2021-08-24: 1000 mL

## 2021-08-24 MED ORDER — PROMETHAZINE HCL 25 MG/ML IJ SOLN
6.2500 mg | INTRAMUSCULAR | Status: DC | PRN
  Administered 2021-08-24: 6.25 mg via INTRAVENOUS

## 2021-08-24 MED ORDER — CHLORHEXIDINE GLUCONATE 0.12 % MT SOLN
15.0000 mL | Freq: Once | OROMUCOSAL | Status: AC
  Administered 2021-08-24: 15 mL via OROMUCOSAL

## 2021-08-24 MED ORDER — HYDROMORPHONE HCL 1 MG/ML IJ SOLN
INTRAMUSCULAR | Status: DC | PRN
  Administered 2021-08-24: .5 mg via INTRAVENOUS

## 2021-08-24 MED ORDER — METHOCARBAMOL 500 MG PO TABS
1000.0000 mg | ORAL_TABLET | Freq: Three times a day (TID) | ORAL | Status: DC
  Administered 2021-08-24 – 2021-08-28 (×12): 1000 mg via ORAL
  Filled 2021-08-24 (×12): qty 2

## 2021-08-24 MED ORDER — OXYCODONE HCL 5 MG PO TABS
5.0000 mg | ORAL_TABLET | ORAL | Status: DC | PRN
  Administered 2021-08-25 – 2021-08-28 (×5): 10 mg via ORAL
  Filled 2021-08-24 (×5): qty 2

## 2021-08-24 MED ORDER — ONDANSETRON HCL 4 MG/2ML IJ SOLN
INTRAMUSCULAR | Status: DC | PRN
  Administered 2021-08-24: 4 mg via INTRAVENOUS

## 2021-08-24 MED ORDER — MIDAZOLAM HCL 2 MG/2ML IJ SOLN
INTRAMUSCULAR | Status: AC
  Filled 2021-08-24: qty 2

## 2021-08-24 MED ORDER — SCOPOLAMINE 1 MG/3DAYS TD PT72
1.0000 | MEDICATED_PATCH | TRANSDERMAL | Status: DC
  Administered 2021-08-24: 1.5 mg via TRANSDERMAL
  Filled 2021-08-24: qty 1

## 2021-08-24 MED ORDER — DEXMEDETOMIDINE HCL IN NACL 200 MCG/50ML IV SOLN
INTRAVENOUS | Status: DC | PRN
  Administered 2021-08-24 (×4): 4 ug via INTRAVENOUS

## 2021-08-24 MED ORDER — ORAL CARE MOUTH RINSE: 15.0000 mL | Freq: Once | OROMUCOSAL | Status: AC

## 2021-08-24 MED ORDER — DEXAMETHASONE SODIUM PHOSPHATE 10 MG/ML IJ SOLN
INTRAMUSCULAR | Status: DC | PRN
  Administered 2021-08-24: 10 mg via INTRAVENOUS

## 2021-08-24 MED ORDER — APIXABAN 2.5 MG PO TABS
2.5000 mg | ORAL_TABLET | Freq: Two times a day (BID) | ORAL | Status: DC
  Administered 2021-08-25 – 2021-08-28 (×7): 2.5 mg via ORAL
  Filled 2021-08-24 (×8): qty 1

## 2021-08-24 MED ORDER — IRBESARTAN 150 MG PO TABS
150.0000 mg | ORAL_TABLET | Freq: Every day | ORAL | Status: DC
  Administered 2021-08-25 – 2021-08-28 (×4): 150 mg via ORAL
  Filled 2021-08-24 (×4): qty 1

## 2021-08-24 MED ORDER — CHLORHEXIDINE GLUCONATE 0.12 % MT SOLN
OROMUCOSAL | Status: AC
  Filled 2021-08-24: qty 15

## 2021-08-24 MED ORDER — ROCURONIUM BROMIDE 10 MG/ML (PF) SYRINGE
PREFILLED_SYRINGE | INTRAVENOUS | Status: DC | PRN
  Administered 2021-08-24: 80 mg via INTRAVENOUS
  Administered 2021-08-24 (×2): 20 mg via INTRAVENOUS

## 2021-08-24 MED ORDER — PROPOFOL 10 MG/ML IV BOLUS
INTRAVENOUS | Status: DC | PRN
  Administered 2021-08-24: 150 mg via INTRAVENOUS

## 2021-08-24 MED ORDER — LIDOCAINE 2% (20 MG/ML) 5 ML SYRINGE
INTRAMUSCULAR | Status: AC
  Filled 2021-08-24: qty 5

## 2021-08-24 MED ORDER — ONDANSETRON 4 MG PO TBDP: 4.0000 mg | ORAL_TABLET | Freq: Three times a day (TID) | ORAL | Status: DC | PRN

## 2021-08-24 MED ORDER — CEFAZOLIN SODIUM-DEXTROSE 2-4 GM/100ML-% IV SOLN
2.0000 g | INTRAVENOUS | Status: AC
  Administered 2021-08-24: 2 g via INTRAVENOUS
  Filled 2021-08-24: qty 100

## 2021-08-24 MED ORDER — METOCLOPRAMIDE HCL 5 MG/ML IJ SOLN: 5.0000 mg | Freq: Three times a day (TID) | INTRAMUSCULAR | Status: DC | PRN

## 2021-08-24 SURGICAL SUPPLY — 88 items
BAG COUNTER SPONGE SURGICOUNT (BAG) ×4 IMPLANT
BANDAGE ESMARK 6X9 LF (GAUZE/BANDAGES/DRESSINGS) ×1 IMPLANT
BIT DRILL 100X2.5XANTM LCK (BIT) ×1 IMPLANT
BIT DRILL 2.5X2.75 QC CALB (BIT) ×2 IMPLANT
BIT DRILL CAL (BIT) ×1 IMPLANT
BIT DRL 100X2.5XANTM LCK (BIT) ×1
BLADE CLIPPER SURG (BLADE) IMPLANT
BLADE SURG 10 STRL SS (BLADE) ×2 IMPLANT
BLADE SURG 15 STRL LF DISP TIS (BLADE) ×1 IMPLANT
BLADE SURG 15 STRL SS (BLADE) ×2
BNDG COHESIVE 4X5 TAN STRL (GAUZE/BANDAGES/DRESSINGS) ×2 IMPLANT
BNDG ELASTIC 4X5.8 VLCR STR LF (GAUZE/BANDAGES/DRESSINGS) ×4 IMPLANT
BNDG ELASTIC 6X10 VLCR STRL LF (GAUZE/BANDAGES/DRESSINGS) ×2 IMPLANT
BNDG ELASTIC 6X5.8 VLCR STR LF (GAUZE/BANDAGES/DRESSINGS) ×4 IMPLANT
BNDG ESMARK 6X9 LF (GAUZE/BANDAGES/DRESSINGS) ×2
BNDG GAUZE ELAST 4 BULKY (GAUZE/BANDAGES/DRESSINGS) ×6 IMPLANT
BRUSH SCRUB EZ PLAIN DRY (MISCELLANEOUS) ×8 IMPLANT
CANISTER SUCT 3000ML PPV (MISCELLANEOUS) ×2 IMPLANT
COVER SURGICAL LIGHT HANDLE (MISCELLANEOUS) ×6 IMPLANT
CUFF TOURN SGL QUICK 34 (TOURNIQUET CUFF) ×2
CUFF TRNQT CYL 34X4.125X (TOURNIQUET CUFF) ×1 IMPLANT
DRAPE C-ARM 42X72 X-RAY (DRAPES) ×2 IMPLANT
DRAPE C-ARMOR (DRAPES) ×4 IMPLANT
DRAPE HALF SHEET 40X57 (DRAPES) IMPLANT
DRAPE INCISE IOBAN 66X45 STRL (DRAPES) ×2 IMPLANT
DRAPE U-SHAPE 47X51 STRL (DRAPES) ×4 IMPLANT
DRILL BIT 2.5MM (BIT) ×2
DRILL BIT CAL (BIT) ×2
DRSG ADAPTIC 3X8 NADH LF (GAUZE/BANDAGES/DRESSINGS) ×2 IMPLANT
DRSG PAD ABDOMINAL 8X10 ST (GAUZE/BANDAGES/DRESSINGS) ×4 IMPLANT
ELECT REM PT RETURN 9FT ADLT (ELECTROSURGICAL) ×4
ELECTRODE REM PT RTRN 9FT ADLT (ELECTROSURGICAL) ×2 IMPLANT
GAUZE SPONGE 4X4 12PLY STRL (GAUZE/BANDAGES/DRESSINGS) ×4 IMPLANT
GAUZE SPONGE 4X4 12PLY STRL LF (GAUZE/BANDAGES/DRESSINGS) ×2 IMPLANT
GLOVE SRG 8 PF TXTR STRL LF DI (GLOVE) ×2 IMPLANT
GLOVE SURG ENC MOIS LTX SZ7.5 (GLOVE) ×4 IMPLANT
GLOVE SURG ENC MOIS LTX SZ8 (GLOVE) ×4 IMPLANT
GLOVE SURG UNDER POLY LF SZ7.5 (GLOVE) ×4 IMPLANT
GLOVE SURG UNDER POLY LF SZ8 (GLOVE) ×4
GOWN STRL REUS W/ TWL LRG LVL3 (GOWN DISPOSABLE) ×4 IMPLANT
GOWN STRL REUS W/ TWL XL LVL3 (GOWN DISPOSABLE) ×2 IMPLANT
GOWN STRL REUS W/TWL LRG LVL3 (GOWN DISPOSABLE) ×8
GOWN STRL REUS W/TWL XL LVL3 (GOWN DISPOSABLE) ×4
IMMOBILIZER KNEE 22 UNIV (SOFTGOODS) ×2 IMPLANT
K-WIRE ACE 1.6X6 (WIRE) ×6
KIT BASIN OR (CUSTOM PROCEDURE TRAY) ×4 IMPLANT
KIT TURNOVER KIT B (KITS) ×4 IMPLANT
KWIRE ACE 1.6X6 (WIRE) ×3 IMPLANT
MANIFOLD NEPTUNE II (INSTRUMENTS) ×2 IMPLANT
NDL SUT 6 .5 CRC .975X.05 MAYO (NEEDLE) IMPLANT
NEEDLE MAYO TAPER (NEEDLE)
NS IRRIG 1000ML POUR BTL (IV SOLUTION) ×4 IMPLANT
PACK ORTHO EXTREMITY (CUSTOM PROCEDURE TRAY) ×4 IMPLANT
PAD ABD 8X10 STRL (GAUZE/BANDAGES/DRESSINGS) ×2 IMPLANT
PAD ARMBOARD 7.5X6 YLW CONV (MISCELLANEOUS) ×8 IMPLANT
PAD CAST 4YDX4 CTTN HI CHSV (CAST SUPPLIES) ×1 IMPLANT
PADDING CAST COTTON 4X4 STRL (CAST SUPPLIES) ×2
PADDING CAST COTTON 6X4 STRL (CAST SUPPLIES) ×2 IMPLANT
PLATE LOCK 7H STD RT PROX TIB (Plate) ×2 IMPLANT
SCREW CORTICAL LOW PROF 3.5X32 (Screw) ×2 IMPLANT
SCREW LOCK CORT STAR 3.5X60 (Screw) ×4 IMPLANT
SCREW LOCK CORT STAR 3.5X65 (Screw) ×2 IMPLANT
SCREW LOCK CORT STAR 3.5X70 (Screw) ×8 IMPLANT
SCREW LOCK CORT STAR 3.5X75 (Screw) ×2 IMPLANT
SCREW LOW PROF TIS 3.5X28MM (Screw) ×2 IMPLANT
SCREW LOW PROFILE 3.5X30MM TIS (Screw) ×2 IMPLANT
SCREW LP NL T15 3.5X24 (Screw) ×2 IMPLANT
SCREW T15 MD 3.5X60MM NS (Screw) ×2 IMPLANT
SPONGE T-LAP 18X18 ~~LOC~~+RFID (SPONGE) ×4 IMPLANT
STAPLER VISISTAT 35W (STAPLE) ×2 IMPLANT
STOCKINETTE IMPERVIOUS LG (DRAPES) ×2 IMPLANT
SUCTION FRAZIER HANDLE 10FR (MISCELLANEOUS) ×2
SUCTION TUBE FRAZIER 10FR DISP (MISCELLANEOUS) ×1 IMPLANT
SUT ETHILON 3 0 PS 1 (SUTURE) IMPLANT
SUT PROLENE 0 CT 2 (SUTURE) ×4 IMPLANT
SUT VIC AB 0 CT1 27 (SUTURE) ×2
SUT VIC AB 0 CT1 27XBRD ANBCTR (SUTURE) ×1 IMPLANT
SUT VIC AB 1 CT1 27 (SUTURE) ×2
SUT VIC AB 1 CT1 27XBRD ANBCTR (SUTURE) ×1 IMPLANT
SUT VIC AB 2-0 CT1 27 (SUTURE) ×4
SUT VIC AB 2-0 CT1 TAPERPNT 27 (SUTURE) ×2 IMPLANT
TOWEL GREEN STERILE (TOWEL DISPOSABLE) ×8 IMPLANT
TOWEL GREEN STERILE FF (TOWEL DISPOSABLE) ×6 IMPLANT
TRAY FOLEY MTR SLVR 16FR STAT (SET/KITS/TRAYS/PACK) IMPLANT
TUBE CONNECTING 12X1/4 (SUCTIONS) ×2 IMPLANT
UNDERPAD 30X36 HEAVY ABSORB (UNDERPADS AND DIAPERS) ×2 IMPLANT
WATER STERILE IRR 1000ML POUR (IV SOLUTION) ×8 IMPLANT
YANKAUER SUCT BULB TIP NO VENT (SUCTIONS) ×2 IMPLANT

## 2021-08-24 NOTE — Transfer of Care (Signed)
Immediate Anesthesia Transfer of Care Note  Patient: Faith Stevens  Procedure(s) Performed: OPEN REDUCTION INTERNAL FIXATION (ORIF) TIBIAL PLATEAU (Right) REMOVAL EXTERNAL FIXATION LEG (Right)  Patient Location: PACU  Anesthesia Type:General  Level of Consciousness: awake, alert  and oriented  Airway & Oxygen Therapy: Patient Spontanous Breathing and Patient connected to face mask oxygen  Post-op Assessment: Report given to RN, Post -op Vital signs reviewed and stable and Patient moving all extremities X 4  Post vital signs: Reviewed and stable  Last Vitals:  Vitals Value Taken Time  BP 145/77 08/24/21 1139  Temp    Pulse 84 08/24/21 1142  Resp 15 08/24/21 1142  SpO2 100 % 08/24/21 1142  Vitals shown include unvalidated device data.  Last Pain:  Vitals:   08/24/21 0647  TempSrc:   PainSc: 3       Patients Stated Pain Goal: 2 (08/24/21 2330)  Complications: No notable events documented.

## 2021-08-24 NOTE — Op Note (Signed)
4:42 PM 08/24/2021 08/24/2021  PATIENT:  Faith Stevens  60 y.o. female  [id number]  PRE-OPERATIVE DIAGNOSIS:  right bicondylar tibial plateau fracture  POST-OPERATIVE DIAGNOSIS:  right bicondylar tibial plateau fracture  PROCEDURE:   1. ORIF OF RIGHT BICONDYLAR TIBIAL PLATEAU FRACTURE  2. ARTHROTOMY WITH PARTIAL MENISCECTOMY RIGHT LATERAL MENISCUS 4. ANTERIOR COMPARTMENT FASCIOTOMY 5. REMOVAL OF EXTERNAL FIXATOR UNDER ANESTHESIA 6. CURETTAGE OF ULCERATED PIN SITES INCLUDING BONE OF TIBIA AND FEMUR  SURGEON:  Surgeon(s) and Role: Myrene Galas, MD   PHYSICIAN ASSISTANT: Montez Morita, PA-C  ANESTHESIA:   general  I/O:  Total I/O In: 904.9 [I.V.:904.9] Out: 975 [Urine:725; Blood:250]  SPECIMEN:  None  TOURNIQUET:  * No tourniquets in log *  DICTATION: .Note written in EPIC  DISPOSITION: PACU  CONDITION: STABLE     BRIEF SUMMARY AND INDICATION FOR PROCEDURE:  Patient is a 60 y.o.-year- old with a tibial plateau fracture, treated provisionally with external fixation, aggressive ice, elevation, and active motion of the foot and toes to facilitate resolution of soft tissue swelling.  We did discuss with the patient the risks and benefits of surgical treatment including the potential for arthritis, nerve injury, vessel injury, loss of motion, DVT, PE, heart attack, stroke, symptomatic hardware, need for further surgery, and multiple others.  The patient acknowledged these risks and wished to proceed.   BRIEF SUMMARY OF PROCEDURE:  After administration of preoperative antibiotics, the patient was taken to the operating room.  General anesthesia was induced. The lower extremity prepped and draped in usual sterile fashion using a chlorhexidine wash and betadine scrub and paint. A timeout was performed.  We did retain the fixator to protect the neurovascular structures during prepping. Once time-out was held, the leg was isolated from the clamps with towels and the fixator removed.   The ulcerated pin sites, which each measured approximately 10 mm in diameter were then debrided with serial curettes at the skin, subcutaneous tissue, muscle fascia, and cortical bone levels, excising all nonviable, contaminated, and devitalized tissue along the tracts back to healthy pink tissue.  Again, the depth of debridement was from skin to bone.  These were irrigated thoroughly. Additional Betadine paint was performed, Ioban used to isolate them from the surgical field, and new gloves obtained by operative staff.    I then brought in the radiolucent triangle.  A curvilinear incision was made extending laterally over Gerdy's tubercle. Dissection was carried down where the soft tissues were left intact to the lateral plateau and rim.  I did incise the retinaculum proximal to the tibial plateau, and then going along inside the retinaculum performed a submeniscal arthrotomy, releasing the coronary ligament along its insertion onto the tibia. Zero prolene suture was used to reflect this and inspect the meniscus and joint surface. 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 The lateral meniscus had extensive degenerative tearing involving body as well as both anterior and posterior horns.  After booking open the lateral plateau segment I was able to use a 10 and 15 blade to sharply debride the torn area back to a stable and healthy appearing edge, but this was most of the meniscus.  The leg was then positioned on a bone foam and brought into extension to enable reduction after using curettage and lavage to remove fracture hematoma on the medial and lateral sides then correcting translation and holding provisional  reduction with k wires. I then used the [employer] clamp to apply a compressive force across the joint line. This reduced the widened plateau back to  the appropriate size.  At this point, we placed standard fixation in the shaft and then proximal row of the plate followed by locked fixation.  The defect in the metaphysis was filled with cancellous bone and tamped into place. My assistant was careful to control alignment throughout by using traction and bending forces. He also assited with retraction. All wounds were irrigated thoroughly. Final AP and LAT fluoro images showed restoration of alignment, reduction, and varus/ valgus stability on stress view.   Prior to closure, I turned my attention to the distal edge of the wound here underneath the skin.  I used the long scissors to spread both superficial and deep to the anterior compartment.  The fascia was then released for 8 to 10 cm to reduce the likelihood of the postoperative compartment syndrome.  Once more, wound was irrigated and then a standard layered closure performed, 0 Vicryl, 2-0 Vicryl, and 3-0 nylon for the skin.  Sterile gently compressive dressing was applied and in knee immobilizer.  The patient was taken to the PACU in stable condition.   PROGNOSIS: The patient will be transitioned into a hinged knee brace with unrestricted range of motion and this will begin immediately.  Orders entered for nonweightbearing on the operative extremity, pharmacologic DVT prophylaxis, and mobilization with PT and OT. After discharge, we will plan to see the patient back in about 2 weeks for removal of sutures and we will continue to follow throughout the hospital stay.         Doralee Albino. Carola Frost, M.D.

## 2021-08-24 NOTE — Progress Notes (Signed)
Orthopedic Tech Progress Note Patient Details:  Faith Stevens 11/27/1961 [id number]   Trapeze applied  [name] 08/24/2021, 5:32 PM

## 2021-08-24 NOTE — TOC Benefit Eligibility Note (Signed)
Patient Product/process development scientist completed.    The patient is currently admitted and upon discharge could be taking Xarelto 10 mg.  Patient's insurance is not contracted with any [facility] not able to get a copay.  The patient is insured through [employer]     Roland Earl, CPhT Pharmacy Patient Advocate Specialist [facility] Antimicrobial Stewardship Team Direct Number: [phone]  Fax: [phone]

## 2021-08-24 NOTE — Anesthesia Procedure Notes (Signed)
Procedure Name: Intubation Date/Time: 08/24/2021 8:26 AM Performed by: Annamary Carolin, CRNA Pre-anesthesia Checklist: Patient identified, Emergency Drugs available, Suction available and Patient being monitored Patient Re-evaluated:Patient Re-evaluated prior to induction Oxygen Delivery Method: Circle System Utilized Preoxygenation: Pre-oxygenation with 100% oxygen Induction Type: IV induction Ventilation: Mask ventilation without difficulty Laryngoscope Size: Mac and 3 Grade View: Grade II Tube type: Oral Number of attempts: 1 Airway Equipment and Method: Stylet and Oral airway Placement Confirmation: ETT inserted through vocal cords under direct vision, positive ETCO2 and breath sounds checked- equal and bilateral Tube secured with: Tape Dental Injury: Teeth and Oropharynx as per pre-operative assessment

## 2021-08-24 NOTE — Progress Notes (Signed)
Orthopedic Tech Progress Note Patient Details:  Faith Stevens [birth year] [id number] Called in order to [facility] Patient ID: [name], female   DOB: [birth year], 60 y.o.   MRN: [id number]  [name] 08/24/2021, 12:42 PM

## 2021-08-24 NOTE — Discharge Instructions (Addendum)
Information on my medicine - ELIQUIS (apixaban)  This medication education was reviewed with me or my healthcare representative as part of my discharge preparation.    Why was Eliquis prescribed for you? Eliquis was prescribed for you to reduce the risk of blood clots forming after orthopedic surgery.    What do You need to know about Eliquis? Take your Eliquis TWICE DAILY - one tablet in the morning and one tablet in the evening with or without food.  It would be best to take the dose about the same time each day.  If you have difficulty swallowing the tablet whole please discuss with your pharmacist how to take the medication safely.  Take Eliquis exactly as prescribed by your doctor and DO NOT stop taking Eliquis without talking to the doctor who prescribed the medication.  Stopping without other medication to take the place of Eliquis may increase your risk of developing a clot.  After discharge, you should have regular check-up appointments with your healthcare provider that is prescribing your Eliquis.  What do you do if you miss a dose? If a dose of ELIQUIS is not taken at the scheduled time, take it as soon as possible on the same day and twice-daily administration should be resumed.  The dose should not be doubled to make up for a missed dose.  Do not take more than one tablet of ELIQUIS at the same time.  Important Safety Information A possible side effect of Eliquis is bleeding. You should call your healthcare provider right away if you experience any of the following: Bleeding from an injury or your nose that does not stop. Unusual colored urine (red or dark brown) or unusual colored stools (red or black). Unusual bruising for unknown reasons. A serious fall or if you hit your head (even if there is no bleeding).  Some medicines may interact with Eliquis and might increase your risk of bleeding or clotting while on Eliquis. To help avoid this, consult your  healthcare provider or pharmacist prior to using any new prescription or non-prescription medications, including herbals, vitamins, non-steroidal anti-inflammatory drugs (NSAIDs) and supplements.  This website has more information on Eliquis (apixaban): http://www.eliquis.com/eliquis/home     [facility] Discharge Instructions   General Discharge Instructions  Orthopaedic Injuries:  Right tibial plateau fracture treated with open reduction internal fixation using plate and screws  WEIGHT BEARING STATUS: Nonweightbearing right leg.  RANGE OF MOTION/ACTIVITY: Unrestricted range of motion right knee.  Activity as tolerated while maintaining weightbearing restrictions.  Continue to perform exercises that therapy taught you several times a day.  Do not let a pillow or blanket rest under the bend of the knee when at rest.  Knee is either fully straight or although a bent (90 degrees)  Bone health: continue with home vitamin d  Wound Care:  Discharge Wound Care Instructions  Do NOT apply any ointments, solutions or lotions to pin sites or surgical wounds.  These prevent needed drainage and even though solutions like hydrogen peroxide kill bacteria, they also damage cells lining the pin sites that help fight infection.  Applying lotions or ointments can keep the wounds moist and can cause them to breakdown and open up as well. This can increase the risk for infection. When in doubt call the office.  Surgical incisions should be dressed daily.  If any drainage is noted, use one layer of adaptic, then gauze, Kerlix, and an ace wrap.  Alternatively you can use a Mepilex type dressing which  is the beige dressing you have on.  You may also use a compression sock instead of an Ace wrap  Once the incision is completely dry and without drainage, it may be left open to air out.  Showering may begin 36-48 hours later.  Cleaning gently with soap and water.    DVT/PE prophylaxis:eliquis  2.5 mg tablet every 12 hours x 4 weeks   Diet: as you were eating previously.  Can use over the counter stool softeners and bowel preparations, such as Miralax, to help with bowel movements.  Narcotics can be constipating.  Be sure to drink plenty of fluids  PAIN MEDICATION USE AND EXPECTATIONS  You have likely been given narcotic medications to help control your pain.  After a traumatic event that results in an fracture (broken bone) with or without surgery, it is ok to use narcotic pain medications to help control one's pain.  We understand that everyone responds to pain differently and each individual patient will be evaluated on a regular basis for the continued need for narcotic medications. Ideally, narcotic medication use should last no more than 6-8 weeks (coinciding with fracture healing).   As a patient it is your responsibility as well to monitor narcotic medication use and report the amount and frequency you use these medications when you come to your office visit.   We would also advise that if you are using narcotic medications, you should take a dose prior to therapy to maximize you participation.  IF YOU ARE ON NARCOTIC MEDICATIONS IT IS NOT PERMISSIBLE TO OPERATE A MOTOR VEHICLE (MOTORCYCLE/CAR/TRUCK/MOPED) OR HEAVY MACHINERY DO NOT MIX NARCOTICS WITH OTHER CNS (CENTRAL NERVOUS SYSTEM) DEPRESSANTS SUCH AS ALCOHOL   POST-OPERATIVE OPIOID TAPER INSTRUCTIONS: It is important to wean off of your opioid medication as soon as possible. If you do not need pain medication after your surgery it is ok to stop day one. Opioids include: Codeine, Hydrocodone(Norco, Vicodin), Oxycodone(Percocet, oxycontin) and hydromorphone amongst others.  Long term and even short term use of opiods can cause: Increased pain response Dependence Constipation Depression Respiratory depression And more.  Withdrawal symptoms can include Flu like symptoms Nausea, vomiting And more Techniques to manage  these symptoms Hydrate well Eat regular healthy meals Stay active Use relaxation techniques(deep breathing, meditating, yoga) Do Not substitute Alcohol to help with tapering If you have been on opioids for less than two weeks and do not have pain than it is ok to stop all together.  Plan to wean off of opioids This plan should start within one week post op of your fracture surgery  Maintain the same interval or time between taking each dose and first decrease the dose.  Cut the total daily intake of opioids by one tablet each day Next start to increase the time between doses. The last dose that should be eliminated is the evening dose.    STOP SMOKING OR USING NICOTINE PRODUCTS!!!!  As discussed nicotine severely impairs your body's ability to heal surgical and traumatic wounds but also impairs bone healing.  Wounds and bone heal by forming microscopic blood vessels (angiogenesis) and nicotine is a vasoconstrictor (essentially, shrinks blood vessels).  Therefore, if vasoconstriction occurs to these microscopic blood vessels they essentially disappear and are unable to deliver necessary nutrients to the healing tissue.  This is one modifiable factor that you can do to dramatically increase your chances of healing your injury.    (This means no smoking, no nicotine gum, patches, etc)  DO NOT USE NONSTEROIDAL  ANTI-INFLAMMATORY DRUGS (NSAID'S)  Using products such as Advil (ibuprofen), Aleve (naproxen), Motrin (ibuprofen) for additional pain control during fracture healing can delay and/or prevent the healing response.  If you would like to take over the counter (OTC) medication, Tylenol (acetaminophen) is ok.  However, some narcotic medications that are given for pain control contain acetaminophen as well. Therefore, you should not exceed more than 4000 mg of tylenol in a day if you do not have liver disease.  Also note that there are may OTC medicines, such as cold medicines and allergy medicines  that my contain tylenol as well.  If you have any questions about medications and/or interactions please ask your doctor/PA or your pharmacist.      ICE AND ELEVATE INJURED/OPERATIVE EXTREMITY  Using ice and elevating the injured extremity above your heart can help with swelling and pain control.  Icing in a pulsatile fashion, such as 20 minutes on and 20 minutes off, can be followed.    Do not place ice directly on skin. Make sure there is a barrier between to skin and the ice pack.    Using frozen items such as frozen peas works well as the conform nicely to the are that needs to be iced.  USE AN ACE WRAP OR TED HOSE FOR SWELLING CONTROL  In addition to icing and elevation, Ace wraps or TED hose are used to help limit and resolve swelling.  It is recommended to use Ace wraps or TED hose until you are informed to stop.    When using Ace Wraps start the wrapping distally (farthest away from the body) and wrap proximally (closer to the body)   Example: If you had surgery on your leg or thing and you do not have a splint on, start the ace wrap at the toes and work your way up to the thigh        If you had surgery on your upper extremity and do not have a splint on, start the ace wrap at your fingers and work your way up to the upper arm  IF YOU ARE IN A SPLINT OR CAST DO NOT REMOVE IT FOR ANY REASON   If your splint gets wet for any reason please contact the office immediately. You may shower in your splint or cast as long as you keep it dry.  This can be done by wrapping in a cast cover or garbage back (or similar)  Do Not stick any thing down your splint or cast such as pencils, money, or hangers to try and scratch yourself with.  If you feel itchy take benadryl as prescribed on the bottle for itching  IF YOU ARE IN A CAM BOOT (BLACK BOOT)  You may remove boot periodically. Perform daily dressing changes as noted below.  Wash the liner of the boot regularly and wear a sock when wearing the boot.  It is recommended that you sleep in the boot until told otherwise    Call office for the following: Temperature greater than 101F Persistent nausea and vomiting Severe uncontrolled pain Redness, tenderness, or signs of infection (pain, swelling, redness, odor or green/yellow discharge around the site) Difficulty breathing, headache or visual disturbances Hives Persistent dizziness or light-headedness Extreme fatigue Any other questions or concerns you may have after discharge  In an emergency, call 911 or go to an Emergency Department at a nearby hospital  HELPFUL INFORMATION  If you had a block, it will wear off between 8-24 hrs postop typically.  This is period when your pain may go from nearly zero to the pain you would have had postop without the block.  This is an abrupt transition but nothing dangerous is happening.  You may take an extra dose of narcotic when this happens.  You should wean off your narcotic medicines as soon as you are able.  Most patients will be off or using minimal narcotics before their first postop appointment.   We suggest you use the pain medication the first night prior to going to bed, in order to ease any pain when the anesthesia wears off. You should avoid taking pain medications on an empty stomach as it will make you nauseous.  Do not drink alcoholic beverages or take illicit drugs when taking pain medications.  In most states it is against the law to drive while you are in a splint or sling.  And certainly against the law to drive while taking narcotics.  You may return to work/school in the next couple of days when you feel up to it.   Pain medication may make you constipated.  Below are a few solutions to try in this order: Decrease the amount of pain medication if you aren't having pain. Drink lots of decaffeinated fluids. Drink prune juice and/or each dried prunes  If the first 3 don't work start with additional solutions Take Colace - an  over-the-counter stool softener Take Senokot - an over-the-counter laxative Take Miralax - a stronger over-the-counter laxative     CALL THE OFFICE WITH ANY QUESTIONS OR CONCERNS: [phone]   VISIT OUR WEBSITE FOR ADDITIONAL INFORMATION: [email or web address]

## 2021-08-24 NOTE — Progress Notes (Signed)
5 mg of oxycodone returned as patient wanted 10 mg instead of the 15 mg pulled.

## 2021-08-24 NOTE — Progress Notes (Signed)
New patient admitted into 6N 11 for continuation of care. ORIF to the right tibia and extremity wrapped with compression band and elevated on a foam. No tingling sensation reported by patient. AxOx4.

## 2021-08-24 NOTE — Plan of Care (Signed)

## 2021-08-24 NOTE — H&P (Signed)
H&P Update  No significant interval changes. Examination unchanged RLE Dressing intact, clean, dry  Edema/ swelling controlled  Sens: DPN, SPN, TN intact  Motor: EHL, FHL, and lessor toe ext and flex all intact grossly  Brisk cap refill, warm to touch  I discussed with the patient the risks and benefits of surgery for her right knee, including the possibility of infection, nerve injury, vessel injury, wound breakdown, arthritis, symptomatic hardware, DVT/ PE, loss of motion, malunion, nonunion, and need for further surgery among others.  She acknowledged these risks and wished to proceed.  Myrene Galas, MD [facility], [facility] [phone]

## 2021-08-24 NOTE — Progress Notes (Signed)
Orthopedic Tech Progress Note Patient Details:  Faith Stevens [birth year] [id number] Dropped off at OR desk Ortho Devices Type of Ortho Device: Bone foam zero knee Ortho Device/Splint Interventions: Ordered      Franklyn Cafaro A Jameela Michna 08/24/2021, 11:13 AM

## 2021-08-24 NOTE — Anesthesia Postprocedure Evaluation (Signed)
Anesthesia Post Note  Patient: Faith Stevens  Procedure(s) Performed: OPEN REDUCTION INTERNAL FIXATION (ORIF) TIBIAL PLATEAU (Right) REMOVAL EXTERNAL FIXATION LEG (Right)     Patient location during evaluation: PACU Anesthesia Type: General Level of consciousness: sedated Pain management: pain level controlled Vital Signs Assessment: post-procedure vital signs reviewed and stable Respiratory status: spontaneous breathing and respiratory function stable Cardiovascular status: stable Postop Assessment: no apparent nausea or vomiting Anesthetic complications: no   No notable events documented.  Last Vitals:  Vitals:   08/24/21 1310 08/24/21 1325  BP: (!) 143/68 (!) 147/68  Pulse: 72 82  Resp: 14 15  Temp:    SpO2: 96% 98%    Last Pain:  Vitals:   08/24/21 1240  TempSrc:   PainSc: Asleep                 Jeramyah Goodpasture DANIEL

## 2021-08-24 NOTE — Progress Notes (Signed)
ANTICOAGULATION CONSULT NOTE - Initial Consult  Pharmacy Consult for Rivaroxaban >> Apixaban Indication: VTE prophylaxis  Allergies  Allergen Reactions   Latex Swelling    Patient Measurements: Height: 5\' 4"  (162.6 cm) Weight: 89.4 kg (197 lb) IBW/kg (Calculated) : 54.7    Vital Signs: Temp: 98.4 F (36.9 C) (08/25 1325) Temp Source: Oral (08/25 0602) BP: 147/68 (08/25 1325) Pulse Rate: 82 (08/25 1325)  Labs: Recent Labs    08/24/21 0549  HGB 9.9*  HCT 31.1*  PLT 374  APTT 28  LABPROT 13.1  INR 1.0  CREATININE 0.62    Estimated Creatinine Clearance: 84 mL/min (by C-G formula based on SCr of 0.62 mg/dL).   Medical History: Past Medical History:  Diagnosis Date   Anxiety 08/14/2021   Essential hypertension 08/14/2021   HTN (hypertension) with goal to be determined      Assessment: 60 yo W s/p 8/25 RLE ORIF. Pharmacy consulted for rivaroxaban for DVT ppx. Unable to check copay due to patient's insurance not being accepted at [facility]. Spoke with team who agreed with switch to apixaban. Give for 28 days per team.   Goal of Therapy: Monitor platelets by anticoagulation protocol: Yes   Plan:  Apixaban 2.5mg  BID x28 days starting 8/26  Monitor for signs/symptoms of bleeding   9/26, PharmD, BCPS, BCCP Clinical Pharmacist  Please check AMION for all [facility] phone numbers After 10:00 PM, call Main Pharmacy [phone]

## 2021-08-25 ENCOUNTER — Encounter (HOSPITAL_COMMUNITY): Payer: Self-pay | Admitting: Orthopedic Surgery

## 2021-08-25 LAB — BASIC METABOLIC PANEL
Anion gap: 8 (ref 5–15)
BUN: 10 mg/dL (ref 6–20)
CO2: 24 mmol/L (ref 22–32)
Calcium: 8.7 mg/dL — ABNORMAL LOW (ref 8.9–10.3)
Chloride: 105 mmol/L (ref 98–111)
Creatinine, Ser: 0.55 mg/dL (ref 0.44–1.00)
GFR, Estimated: >60 mL/min (ref >60)
Glucose, Bld: 99 mg/dL (ref 70–99)
Potassium: 4.1 mmol/L (ref 3.5–5.1)
Sodium: 137 mmol/L (ref 135–145)

## 2021-08-25 MED ORDER — METHOCARBAMOL 500 MG PO TABS: 500.0000 mg | ORAL_TABLET | Freq: Three times a day (TID) | ORAL | 0 refills | Status: DC | PRN

## 2021-08-25 MED ORDER — APIXABAN 2.5 MG PO TABS: 2.5000 mg | ORAL_TABLET | Freq: Two times a day (BID) | ORAL | 0 refills | Status: DC

## 2021-08-25 MED ORDER — ACETAMINOPHEN 325 MG PO TABS: 650.0000 mg | ORAL_TABLET | Freq: Two times a day (BID) | ORAL | 0 refills | Status: DC

## 2021-08-25 MED ORDER — DOCUSATE SODIUM 100 MG PO CAPS: 100.0000 mg | ORAL_CAPSULE | Freq: Two times a day (BID) | ORAL | 0 refills | Status: DC

## 2021-08-25 MED ORDER — OXYCODONE-ACETAMINOPHEN 5-325 MG PO TABS: 1.0000 | ORAL_TABLET | Freq: Four times a day (QID) | ORAL | 0 refills | Status: AC | PRN

## 2021-08-25 NOTE — Progress Notes (Addendum)
Spoke w/on call concerning pt wanting to use purewick while sleeping. She stated that it was fine while sleeping but will try to not use while up.   Pt is very relieved by this.    0630 foley removed w/o complications/complaints. Pt provided pericare w/green wipes, before/after removal. Pt decided to leave purewick out and will try [facility] with family at bedside.

## 2021-08-25 NOTE — Progress Notes (Addendum)
Orthopaedic Trauma Service Progress Note  Patient ID: Faith Stevens MRN: [id number] DOB/AGE: 02/26/1961 60 y.o.  Subjective:  Doing okay Pain is pretty moderate in her right knee She has been able to use a bedside commode wound  Denies any new numbness or tingling  Tolerating diet  ROS As above  Objective:   VITALS:   Vitals:   08/24/21 1955 08/25/21 0007 08/25/21 0533 08/25/21 0800  BP: (!) 152/61 130/64 132/64 (!) 143/60  Pulse: 79 79 82 73  Resp: 16 16 17 16   Temp: 98.8 F (37.1 C) 98.6 F (37 C) 98.5 F (36.9 C) 98.2 F (36.8 C)  TempSrc: Oral Oral Oral Oral  SpO2: 100% 98% 99% 100%  Weight:      Height:        Estimated body mass index is 33.81 kg/m as calculated from the following:   Height as of this encounter: 5\' 4"  (1.626 m).   Weight as of this encounter: 89.4 kg.   Intake/Output      08/25 0701 08/26 0700 08/26 0701 08/27 0700   P.O. 120    I.V. (mL/kg) 904.9 (10.1)    IV Piggyback 0    Total Intake(mL/kg) 1024.9 (11.5)    Urine (mL/kg/hr) 1975 (0.9)    Blood 250    Total Output 2225    Net -1200.1           LABS  Results for orders placed or performed during the hospital encounter of 08/24/21 (from the past 24 hour(s))  Basic metabolic panel     Status: Abnormal   Collection Time: 08/25/21  1:48 AM  Result Value Ref Range   Sodium 137 135 - 145 mmol/L   Potassium 4.1 3.5 - 5.1 mmol/L   Chloride 105 98 - 111 mmol/L   CO2 24 22 - 32 mmol/L   Glucose, Bld 99 70 - 99 mg/dL   BUN 10 6 - 20 mg/dL   Creatinine, Ser 08/26/21 0.44 - 1.00 mg/dL   Calcium 8.7 (L) 8.9 - 10.3 mg/dL   GFR, Estimated 08/27/21 3.66 mL/min   Anion gap 8 5 - 15     PHYSICAL EXAM:   Gen: Resting comfortably in bed, no acute distress, appears well Lungs: Unlabored Cardiac: Regular Ext:       Right lower extremity  Dressing is clean, dry and intact  Extremity is warm  Swelling is  controlled  Knee is resting in full extension.  Blankets are under the ankle.  She is taking a break from the bone foam  DPN, SPN, TN sensory functions intact  EHL, FHL, lesser toe motor functions intact.  Ankle flexion, extension, inversion eversion intact  Compartments are soft.  No pain out of proportion with passive stretching  + DP pulse   Assessment/Plan: 1 Day Post-Op     Anti-infectives (From admission, onward)    Start     Dose/Rate Route Frequency Ordered Stop   08/24/21 1530  ceFAZolin (ANCEF) IVPB 2g/100 mL premix        2 g 200 mL/hr over 30 Minutes Intravenous Every 6 hours 08/24/21 1453 08/25/21 0600   08/24/21 0600  ceFAZolin (ANCEF) IVPB 2g/100 mL premix        2 g 200 mL/hr over 30 Minutes Intravenous On call to O.R. 08/24/21 08/26/21 08/24/21 0827     .  POD/HD#: 65  60 year old female fall approximately 2 weeks ago with complex bicondylar right tibial plateau fracture.  Treated initially with external fixation now s/p ORIF and removal of fixator  -Comminuted right bicondylar tibial plateau fracture s/p ORIF  Nonweightbearing right leg x 8 weeks  Unrestricted range of motion right knee   Hinged knee brace only needs to be on when mobilizing otherwise it may be off   Knee is to be resting in full extension.  Can dangle off the bed or chair.  Do not place any pillows/blankets under the bend of the knee  Ice and elevate for swelling and pain control  Dressing changes starting tomorrow   PT and OT evaluations  PT- please teach HEP for right knee ROM- AROM, PROM. Prone exercises as well. No ROM restrictions.  Quad sets, SLR, LAQ, SAQ, heel slides, stretching, prone flexion and extension  Ankle theraband program, heel cord stretching, toe towel curls, etc  No pillows under bend of knee when at rest, ok to place under heel to help work on extension. Can also use zero knee bone foam if available    - Pain management:  Multimodal  - ABL  anemia/Hemodynamics  Stable   Cbc in am   - Medical issues   HTN   [place] meds   - DVT/PE prophylaxis:  Start eliquis today x 4 weeks   - ID:   Periop abx  - Metabolic Bone Disease:  Vitamin d levels were good on last admission   Continue home supplementation   - Activity:  NWB R Leg o/w activity as tolerated  - FEN/GI prophylaxis/Foley/Lines:  KVO IVF  NO PUREWICK!!!!!!  - Dispo:  Therapy evals  Likely home Sunday or Monday    Mearl Latin, PA-C [phone] (C) 08/25/2021, 10:35 AM  [facility] [address] [place] [address] [phone] [phone] (F)    After 5pm and on the weekends please log on to Amion, go to orthopaedics and the look under the [facility] Call for the provider(s) on call. You can also call our office at [phone] and then follow the prompts to be connected to the call team.

## 2021-08-25 NOTE — Evaluation (Signed)
Physical Therapy Evaluation Patient Details Name: Faith Stevens MRN: [id number] DOB: [birth year] Today's Date: 08/25/2021   History of Present Illness  The pt is a 60 yo female presenting s/p ORIF of R bicondylar tibial plateau fx on 8/25. She has been in external fixator since falling 3 foot off of a loading dock while moving a box on 8/12. PMH includes: HTN.   Clinical Impression  Pt in bed upon arrival of PT, agreeable to evaluation at this time. Prior to original admission, the pt was completely independent without need for AD, but has been mobilizing with use of WC and pivoting with assist of family following original injury and placement of ex fix. The pt now presents with limitations in functional mobility, strength, ROM in R knee, stability, and activity tolerance due to above dx and resulting pain, and will continue to benefit from skilled PT to address these deficits. Due to current pain levels, pt needing minA to manage RLE during bed mobility and sit-stand or stand-pivot transfers to [facility]. The pt is able to tolerate RLE in dependent position, but requires physical assist to lower comfortably. The pt was able to tolerate very small PROM of R knee at this time, but will continue to benefit from skilled PT to progress R knee ROM, strength, and independence with transfers.      Follow Up Recommendations Home health PT;Supervision/Assistance - 24 hour    Equipment Recommendations  None recommended by PT    Recommendations for Other Services       Precautions / Restrictions Precautions Precautions: Fall Precaution Comments: in hinged knee brace Required Braces or Orthoses: Other Brace Other Brace: hinged knee brace (Hinged knee brace only needs to be on when mobilizing otherwise it may be off per MD note) Restrictions Weight Bearing Restrictions: Yes RLE Weight Bearing: Non weight bearing Other Position/Activity Restrictions: unrestricted knee ROM      Mobility  Bed  Mobility Overal bed mobility: Needs Assistance Bed Mobility: Supine to Sit     Supine to sit: Supervision     General bed mobility comments: pt needing minA to manage RLE, but managing trunk without assist    Transfers Overall transfer level: Needs assistance Equipment used: Rolling walker (2 wheeled);None Transfers: Sit to/from [employer] to Stand: Min assist;+2 physical assistance;+2 safety/equipment Stand pivot transfers: Min assist;+2 physical assistance;+2 safety/equipment       General transfer comment: +2 for safety, one person managing RLE in hinged brace, controlling the lower to ground. additional assist to manage lines/balance. pt with good stability with single UE support  Ambulation/Gait             General Gait Details: unable to complete more than single hop due to UE weakness      Balance Overall balance assessment: Needs assistance Sitting-balance support: No upper extremity supported;Feet supported Sitting balance-Leahy Scale: Good Sitting balance - Comments: no UE support   Standing balance support: Single extremity supported Standing balance-Leahy Scale: Poor Standing balance comment: reliant on UE support                             Pertinent Vitals/Pain Pain Assessment: Faces Faces Pain Scale: Hurts whole lot Pain Location: R knee Pain Descriptors / Indicators: Discomfort Pain Intervention(s): Limited activity within patient's tolerance;Monitored during session;Repositioned    Home Living Family/patient expects to be discharged to:: Private residence Living Arrangements: Spouse/significant other Available Help at Discharge: Family;Available 24 hours/day Type of  Home: House Home Access: Stairs to enter;Ramped entrance (ramp at back) Entrance Stairs-Rails: Right Entrance Stairs-Number of Steps: 5 Home Layout: Two level;Able to live on main level with bedroom/bathroom Home Equipment: Dan Humphreys - 2  wheels;Bedside commode;Wheelchair - manual      Prior Function Level of Independence: Needs assistance   Gait / Transfers Assistance Needed: mobilizing with WC in home, assist for pivot trasnfers  ADL's / Homemaking Assistance Needed: assist from spouse and friends to complete  Comments: independent prior to original injury     Hand Dominance   Dominant Hand: Right    Extremity/Trunk Assessment   Upper Extremity Assessment Upper Extremity Assessment: Defer to OT evaluation    Lower Extremity Assessment Lower Extremity Assessment: RLE deficits/detail RLE Deficits / Details: limited by NWB, pt able to demo good movement at hip, but not able to complete SLR against gravity. able to initiate knee ROM with full support RLE: Unable to fully assess due to immobilization RLE Sensation: WNL    Cervical / Trunk Assessment Cervical / Trunk Assessment: Normal  Communication   Communication: No difficulties  Cognition Arousal/Alertness: Awake/alert Behavior During Therapy: WFL for tasks assessed/performed Overall Cognitive Status: Within Functional Limits for tasks assessed                                 General Comments: pt pleasant and with good safety understanding      General Comments General comments (skin integrity, edema, etc.): VSS    Exercises General Exercises - Lower Extremity Long Arc Quad: PROM;Limitations Long Arc Quad Limitations: limited to 5-10 deg flexion, pt able to tolerate x5 PROM   Assessment/Plan    PT Assessment Patient needs continued PT services  PT Problem List Decreased strength;Decreased mobility;Decreased safety awareness;Decreased activity tolerance;Decreased balance;Decreased knowledge of use of DME;Pain;Decreased knowledge of precautions;Decreased range of motion;Decreased coordination       PT Treatment Interventions DME instruction;Therapeutic activities;Gait training;Therapeutic exercise;Patient/family education;Balance  training;Stair training;Functional mobility training;Neuromuscular re-education    PT Goals (Current goals can be found in the Care Plan section)  Acute Rehab PT Goals Patient Stated Goal: home PT Goal Formulation: With patient Time For Goal Achievement: 09/08/21 Potential to Achieve Goals: Good    Frequency Min 4X/week   Barriers to discharge        Co-evaluation PT/OT/SLP Co-Evaluation/Treatment: Yes Reason for Co-Treatment: Complexity of the patient's impairments (multi-system involvement);Necessary to address cognition/behavior during functional activity PT goals addressed during session: Mobility/safety with mobility;Balance;Strengthening/ROM         AM-PAC PT "6 Clicks" Mobility  Outcome Measure Help needed turning from your back to your side while in a flat bed without using bedrails?: A Little Help needed moving from lying on your back to sitting on the side of a flat bed without using bedrails?: A Little Help needed moving to and from a bed to a chair (including a wheelchair)?: A Little Help needed standing up from a chair using your arms (e.g., wheelchair or bedside chair)?: A Little Help needed to walk in hospital room?: A Lot Help needed climbing 3-5 steps with a railing? : A Lot 6 Click Score: 16    End of Session   Activity Tolerance: Patient tolerated treatment well Patient left: in bed;with call bell/phone within reach;with family/visitor present Nurse Communication: Mobility status PT Visit Diagnosis: Other abnormalities of gait and mobility (R26.89);Pain Pain - Right/Left: Right Pain - part of body: Leg    Time:  5883-2549 PT Time Calculation (min) (ACUTE ONLY): 46 min   Charges:   PT Evaluation $PT Eval Low Complexity: 1 Low PT Treatments $Therapeutic Activity: 8-22 mins        Lazarus Gowda, PT, DPT   Acute Rehabilitation Department Pager #: [phone]  Ronnie Derby 08/25/2021, 2:13 PM

## 2021-08-25 NOTE — Evaluation (Signed)
Occupational Therapy Evaluation Patient Details Name: Faith Stevens MRN: [id number] DOB: 1961/06/03 Today's Date: 08/25/2021    History of Present Illness The pt is a 60 yo female presenting s/p ORIF of R bicondylar tibial plateau fx on 8/25. She has been in external fixator since falling 3 foot off of a loading dock while moving a box on 8/12. PMH includes: HTN.   Clinical Impression   Pt admitted with above. She demonstrates the below listed deficits and will benefit from continued OT to maximize safety and independence with BADLs.  PT presents to OT with increased pain Rt LE, impaired balance, decreased activity tolerance.  She currently requires mod - max A for LB ADLs and min A +2 for functional transfers.  She has been performing transfers with +1-2 person assist at home and has had assist with ADLs since her fall.  Prior to that she was fully independent.  She is very motivated.       Follow Up Recommendations  Home health OT;Supervision/Assistance - 24 hour    Equipment Recommendations  None recommended by OT    Recommendations for Other Services       Precautions / Restrictions Precautions Precautions: Fall Precaution Comments: in hinged knee brace Required Braces or Orthoses: Other Brace Other Brace: hinged knee brace (Hinged knee brace only needs to be on when mobilizing otherwise it may be off per MD note) Restrictions Weight Bearing Restrictions: Yes RLE Weight Bearing: Non weight bearing Other Position/Activity Restrictions: unrestricted knee ROM      Mobility Bed Mobility Overal bed mobility: Needs Assistance Bed Mobility: Supine to Sit;Sit to Supine     Supine to sit: Min assist Sit to supine: Min assist   General bed mobility comments: pt needing minA to manage RLE, but managing trunk without assist    Transfers Overall transfer level: Needs assistance Equipment used: Rolling walker (2 wheeled);None Transfers: Sit to/from [employer]  to Stand: Min assist;+2 physical assistance;+2 safety/equipment Stand pivot transfers: Min assist;+2 physical assistance;+2 safety/equipment       General transfer comment: +2 for safety, one person managing RLE in hinged brace, controlling the lower to ground. additional assist to manage lines/balance. pt with good stability with single UE support    Balance Overall balance assessment: Needs assistance Sitting-balance support: No upper extremity supported;Feet supported Sitting balance-Leahy Scale: Good Sitting balance - Comments: no UE support   Standing balance support: Single extremity supported Standing balance-Leahy Scale: Poor Standing balance comment: reliant on UE support                           ADL either performed or assessed with clinical judgement   ADL Overall ADL's : Needs assistance/impaired Eating/Feeding: Independent   Grooming: Wash/dry hands;Wash/dry face;Oral care;Brushing hair;Set up;Sitting   Upper Body Bathing: Set up;Sitting   Lower Body Bathing: Set up;Sitting/lateral leans;Bed level   Upper Body Dressing : Set up;Sitting   Lower Body Dressing: Maximal assistance;Sit to/from stand Lower Body Dressing Details (indicate cue type and reason): pt Toilet Transfer: Minimal assistance;+2 for safety/equipment;+2 for physical assistance;Stand-pivot;BSC   Toileting- Clothing Manipulation and Hygiene: Moderate assistance;Sit to/from stand       Functional mobility during ADLs: Minimal assistance;+2 for safety/equipment;+2 for physical assistance       Vision         Perception     Praxis      Pertinent Vitals/Pain Pain Assessment: Faces Faces Pain Scale: Hurts whole lot Pain  Location: R knee Pain Descriptors / Indicators: Discomfort Pain Intervention(s): Limited activity within patient's tolerance;Monitored during session;Repositioned     Hand Dominance Right   Extremity/Trunk Assessment Upper Extremity  Assessment Upper Extremity Assessment: Overall WFL for tasks assessed   Lower Extremity Assessment Lower Extremity Assessment: Defer to PT evaluation RLE Deficits / Details: limited by NWB, pt able to demo good movement at hip, but not able to complete SLR against gravity. able to initiate knee ROM with full support RLE: Unable to fully assess due to immobilization RLE Sensation: WNL   Cervical / Trunk Assessment Cervical / Trunk Assessment: Normal   Communication Communication Communication: No difficulties   Cognition Arousal/Alertness: Awake/alert Behavior During Therapy: WFL for tasks assessed/performed Overall Cognitive Status: Within Functional Limits for tasks assessed                                 General Comments: pt pleasant and with good safety understanding   General Comments  VSS    Exercises Exercises: General Lower Extremity General Exercises - Lower Extremity Long Arc Quad: PROM;Limitations Long Arc Quad Limitations: limited to 5-10 deg flexion, pt able to tolerate x5 PROM   Shoulder Instructions      Home Living Family/patient expects to be discharged to:: Private residence Living Arrangements: Spouse/significant other Available Help at Discharge: Family;Available 24 hours/day Type of Home: House Home Access: Stairs to enter;Ramped entrance (ramp at back) Entrance Stairs-Number of Steps: 5 Entrance Stairs-Rails: Right Home Layout: Two level;Able to live on main level with bedroom/bathroom     Bathroom Shower/Tub: Producer, television/film/video: Standard Bathroom Accessibility: Yes   Home Equipment: Environmental consultant - 2 wheels;Bedside commode;Wheelchair - manual          Prior Functioning/Environment Level of Independence: Needs assistance  Gait / Transfers Assistance Needed: mobilizing with WC in home, assist for pivot trasnfers ADL's / Homemaking Assistance Needed: assist from spouse and friends to complete   Comments: independent  prior to original injury        OT Problem List: Decreased strength;Decreased activity tolerance;Impaired balance (sitting and/or standing);Decreased knowledge of use of DME or AE;Decreased knowledge of precautions;Pain      OT Treatment/Interventions: Self-care/ADL training;Therapeutic exercise;Therapeutic activities;Patient/family education;Balance training;DME and/or AE instruction    OT Goals(Current goals can be found in the care plan section) Acute Rehab OT Goals Patient Stated Goal: to have less pain OT Goal Formulation: With patient Time For Goal Achievement: 09/08/21 Potential to Achieve Goals: Good ADL Goals Pt Will Perform Lower Body Bathing: with min assist;sit to/from stand;with adaptive equipment Pt Will Perform Lower Body Dressing: with min assist;sit to/from stand;with adaptive equipment Pt Will Transfer to Toilet: with min guard assist;stand pivot transfer;bedside commode Pt Will Perform Toileting - Clothing Manipulation and hygiene: with min guard assist;sit to/from stand  OT Frequency: Min 2X/week   Barriers to D/C:            Co-evaluation PT/OT/SLP Co-Evaluation/Treatment: Yes Reason for Co-Treatment: For patient/therapist safety;To address functional/ADL transfers PT goals addressed during session: Mobility/safety with mobility;Balance;Strengthening/ROM OT goals addressed during session: ADL's and self-care      AM-PAC OT "6 Clicks" Daily Activity     Outcome Measure Help from another person eating meals?: None Help from another person taking care of personal grooming?: A Little Help from another person toileting, which includes using toliet, bedpan, or urinal?: A Lot Help from another person bathing (including washing, rinsing, drying)?:  A Little Help from another person to put on and taking off regular upper body clothing?: A Little Help from another person to put on and taking off regular lower body clothing?: A Lot 6 Click Score: 17   End of  Session Equipment Utilized During Treatment: Rolling walker Nurse Communication: Mobility status  Activity Tolerance: Patient tolerated treatment well Patient left: in bed;with call bell/phone within reach;with family/visitor present  OT Visit Diagnosis: Pain Pain - Right/Left: Right Pain - part of body: Knee                Time: 6195-0932 OT Time Calculation (min): 46 min Charges:  OT General Charges $OT Visit: 1 Visit OT Treatments $Therapeutic Activity: 8-22 mins  Eber Jones., OTR/L Acute Rehabilitation Services Pager [phone] Office [phone]   Jeani Hawking M 08/25/2021, 3:47 PM

## 2021-08-26 LAB — CBC
HCT: 24.9 % — ABNORMAL LOW (ref 36.0–46.0)
Hemoglobin: 7.9 g/dL — ABNORMAL LOW (ref 12.0–15.0)
MCH: 31 pg (ref 26.0–34.0)
MCHC: 31.7 g/dL (ref 30.0–36.0)
MCV: 97.6 fL (ref 80.0–100.0)
Platelets: 313 10*3/uL (ref 150–400)
RBC: 2.55 MIL/uL — ABNORMAL LOW (ref 3.87–5.11)
RDW: 15.8 % — ABNORMAL HIGH (ref 11.5–15.5)
WBC: 8 10*3/uL (ref 4.0–10.5)
nRBC: 0 % (ref 0.0–0.2)

## 2021-08-26 LAB — BASIC METABOLIC PANEL
Anion gap: 5 (ref 5–15)
BUN: 7 mg/dL (ref 6–20)
CO2: 27 mmol/L (ref 22–32)
Calcium: 8.6 mg/dL — ABNORMAL LOW (ref 8.9–10.3)
Chloride: 103 mmol/L (ref 98–111)
Creatinine, Ser: 0.57 mg/dL (ref 0.44–1.00)
GFR, Estimated: >60 mL/min (ref >60)
Glucose, Bld: 106 mg/dL — ABNORMAL HIGH (ref 70–99)
Potassium: 4 mmol/L (ref 3.5–5.1)
Sodium: 135 mmol/L (ref 135–145)

## 2021-08-26 NOTE — Progress Notes (Signed)
Physical Therapy Treatment Patient Details Name: Maddyson Keil MRN: [id number] DOB: 1961-05-16 Today's Date: 08/26/2021    History of Present Illness The pt is a 60 yo female presenting s/p ORIF of R bicondylar tibial plateau fx on 8/25. She has been in external fixator since falling 3 foot off of a loading dock while moving a box on 8/12. PMH includes: HTN.    PT Comments    Pt awake and agreeable to session. Friend/aid(?) present and assisting in session. 1 person required for LE management with transfers secondary to pain. Pt performed stand pivot transfer 3x with min A +2. This session focused on R LE ROM and pt was able to achieve 25 degrees of knee flexion by end of session. Will continue to follow acutely for mobility progression.     Follow Up Recommendations  Home health PT;Supervision/Assistance - 24 hour     Equipment Recommendations  None recommended by PT    Recommendations for Other Services       Precautions / Restrictions Precautions Precautions: Fall Precaution Comments: in hinged knee brace (declining for transfers due to increased pain with brace donned) Required Braces or Orthoses: Other Brace Other Brace: hinged knee brace (Hinged knee brace only needs to be on when mobilizing otherwise it may be off per MD note) Restrictions Weight Bearing Restrictions: Yes RLE Weight Bearing: Non weight bearing Other Position/Activity Restrictions: unrestricted knee ROM    Mobility  Bed Mobility Overal bed mobility: Needs Assistance Bed Mobility: Supine to Sit;Sit to Supine     Supine to sit: Min guard Sit to supine: Min guard   General bed mobility comments: min guard, using gait belt to manage R LE.    Transfers Overall transfer level: Needs assistance Equipment used: Rolling walker (2 wheeled);None Transfers: Sit to/from [employer] to Stand: Min assist;+2 physical assistance;+2 safety/equipment Stand pivot transfers: Min assist;+2  physical assistance;+2 safety/equipment       General transfer comment: transfered 3x, 2x w/o RW as this is how pt is most comfortable doing it. 1 person HHA and 1 person managing RLE. Utalized RW for transfer back to bed. Pt with dificulty and pain when R LE is unsupported and inside RW.  Ambulation/Gait             General Gait Details: unable due to pain and weakness.   Stairs             Wheelchair Mobility    Modified Rankin (Stroke Patients Only)       Balance Overall balance assessment: Needs assistance Sitting-balance support: No upper extremity supported;Feet supported Sitting balance-Leahy Scale: Good Sitting balance - Comments: no UE support   Standing balance support: Single extremity supported Standing balance-Leahy Scale: Poor Standing balance comment: reliant on UE support                            Cognition Arousal/Alertness: Awake/alert Behavior During Therapy: WFL for tasks assessed/performed Overall Cognitive Status: Within Functional Limits for tasks assessed                                 General Comments: pt pleasant and with good safety understanding      Exercises Total Joint Exercises Goniometric ROM: 0-25 knee flexion General Exercises - Lower Extremity Quad Sets: AROM;Right;5 reps Heel Slides: AAROM;Right;Supine (2 reps, minimal movement secondary to pain) Other Exercises Other Exercises:  knee flexion in sitting; gait belt on foot and pt slowly lowering RLE to allow knee to flex. Can tolerate 3x, reaching 25 degrees of knee flexion.    General Comments General comments (skin integrity, edema, etc.): Assist required for peri care. Pt has aid/friend present and helpful during session.      Pertinent Vitals/Pain Pain Assessment: Faces Faces Pain Scale: Hurts whole lot Pain Location: R knee Pain Descriptors / Indicators: Discomfort;Sharp;Stabbing Pain Intervention(s): Monitored during  session;Limited activity within patient's tolerance;Repositioned;Premedicated before session    Home Living                      Prior Function            PT Goals (current goals can now be found in the care plan section) Acute Rehab PT Goals Patient Stated Goal: to have less pain PT Goal Formulation: With patient Time For Goal Achievement: 09/08/21 Potential to Achieve Goals: Good Progress towards PT goals: Progressing toward goals    Frequency    Min 4X/week      PT Plan Current plan remains appropriate    Co-evaluation              AM-PAC PT "6 Clicks" Mobility   Outcome Measure  Help needed turning from your back to your side while in a flat bed without using bedrails?: A Little Help needed moving from lying on your back to sitting on the side of a flat bed without using bedrails?: A Little Help needed moving to and from a bed to a chair (including a wheelchair)?: A Little Help needed standing up from a chair using your arms (e.g., wheelchair or bedside chair)?: A Little Help needed to walk in hospital room?: A Lot Help needed climbing 3-5 steps with a railing? : A Lot 6 Click Score: 16    End of Session Equipment Utilized During Treatment: Gait belt Activity Tolerance: Patient tolerated treatment well Patient left: in bed;with call bell/phone within reach;with family/visitor present Nurse Communication: Mobility status PT Visit Diagnosis: Other abnormalities of gait and mobility (R26.89);Pain Pain - Right/Left: Right Pain - part of body: Leg     Time: 8756-4332 PT Time Calculation (min) (ACUTE ONLY): 39 min  Charges:  $Therapeutic Exercise: 23-37 mins $Therapeutic Activity: 8-22 mins                     Kallie Locks, [place] Pager [phone] Acute Rehab   Sheral Apley 08/26/2021, 12:47 PM

## 2021-08-26 NOTE — Progress Notes (Addendum)
Orthopaedic Trauma Progress Note  SUBJECTIVE: Reports mild pain about operative site currently.  Notes sharp pain in the front and knee with any knee flexion. No chest pain. No SOB. No nausea/vomiting. No other complaints.  Is tolerating diet and fluids well.  Family at bedside  OBJECTIVE:  Vitals:   08/26/21 0500 08/26/21 0827  BP: (!) 107/48 131/77  Pulse:  79  Resp: 18 18  Temp: 98.3 F (36.8 C) 98.1 F (36.7 C)  SpO2: 98% 100%    General: Sitting up in bed, no acute distress Respiratory: No increased work of breathing.  Right lower extremity: Dressings removed.  Incisions are clean, dry, intact.  New Mepilex dressings applied.  Notable bruising about the knee and throughout the lower leg.  Swelling throughout this area as well, appears stable.  Ankle dorsiflexion/plantarflexion is intact.+ EHL/+ FHL.  Compartments compressible.  Neurovascularly intact  IMAGING: Stable post op imaging.   LABS:  Results for orders placed or performed during the hospital encounter of 08/24/21 (from the past 24 hour(s))  Basic metabolic panel     Status: Abnormal   Collection Time: 08/26/21  2:59 AM  Result Value Ref Range   Sodium 135 135 - 145 mmol/L   Potassium 4.0 3.5 - 5.1 mmol/L   Chloride 103 98 - 111 mmol/L   CO2 27 22 - 32 mmol/L   Glucose, Bld 106 (H) 70 - 99 mg/dL   BUN 7 6 - 20 mg/dL   Creatinine, Ser 3.61 0.44 - 1.00 mg/dL   Calcium 8.6 (L) 8.9 - 10.3 mg/dL   GFR, Estimated >44 >31 mL/min   Anion gap 5 5 - 15  CBC     Status: Abnormal   Collection Time: 08/26/21  2:59 AM  Result Value Ref Range   WBC 8.0 4.0 - 10.5 K/uL   RBC 2.55 (L) 3.87 - 5.11 MIL/uL   Hemoglobin 7.9 (L) 12.0 - 15.0 g/dL   HCT 54.0 (L) 08.6 - 76.1 %   MCV 97.6 80.0 - 100.0 fL   MCH 31.0 26.0 - 34.0 pg   MCHC 31.7 30.0 - 36.0 g/dL   RDW 95.0 (H) 93.2 - 67.1 %   Platelets 313 150 - 400 K/uL   nRBC 0.0 0.0 - 0.2 %    ASSESSMENT: Faith Stevens is a 60 y.o. female, 2 Days Post-Op s/p ORIF RIGHT TIBIAL  PLATEAU REMOVAL EXTERNAL FIXATION LEG  CV/Blood loss: Acute blood loss anemia, Hgb 7.9 this morning.  Continue to monitor.  Hemodynamically stable  PLAN: Weightbearing: NWB RLE ROM: Unrestricted knee motion as tolerated Incisional and dressing care:  Dressing removed, new mepilex applied today. Change PRN Showering: Ok to get incisions wet, clean with soap and water Orthopedic device(s):  Hinge brace needed only when mobilizing with walker, may be off at all other times   Pain management: Continue current multimodal regimen VTE prophylaxis:  Eliquis , TED hose RLE, SCDs LLE ID:  Ancef 2gm post op completed Foley/Lines:  No foley, KVO IVFs Impediments to Fracture Healing: Vitamin D level 39, continue D3 supplementation 1000 units daily Dispo: Therapies as tolerated, PT/OT recommending home health.  Nursing to apply TED hose this morning to right leg.  Continue to monitor CBC.  Plan for discharge likely tomorrow first Monday once mobilizing well with therapies.    Follow - up plan: 2 weeks after discharge with Dr. Apolinar Junes information:  Truitt Merle MD, Ulyses Southward PA-C today 08/26/2021. After hours and holidays please check [email or web address] for  group call information for [facility]   Keary Waterson A. Michaelyn Barter, PA-C [phone] (office) [email or web address]

## 2021-08-27 LAB — BASIC METABOLIC PANEL
Anion gap: 9 (ref 5–15)
BUN: 11 mg/dL (ref 6–20)
CO2: 24 mmol/L (ref 22–32)
Calcium: 8.7 mg/dL — ABNORMAL LOW (ref 8.9–10.3)
Chloride: 104 mmol/L (ref 98–111)
Creatinine, Ser: 0.54 mg/dL (ref 0.44–1.00)
GFR, Estimated: >60 mL/min (ref >60)
Glucose, Bld: 114 mg/dL — ABNORMAL HIGH (ref 70–99)
Potassium: 4.7 mmol/L (ref 3.5–5.1)
Sodium: 137 mmol/L (ref 135–145)

## 2021-08-27 NOTE — Progress Notes (Signed)
   08/27/21 1637  Provider Notification  Provider Name/Title Levester Fresh  Date Provider Notified 08/27/21  Time Provider Notified 1722  Notification Type Page  Notification Reason  (Per order, DBP <60)  PT BP 133/58

## 2021-08-27 NOTE — Progress Notes (Signed)
    Subjective: Patient reports pain as moderate. Worse with movement. Tolerating diet better today. Urinating. No CP, SOB. Has worked with PT/OT on mobilization OOB. Worried about ability to move around once d/c.  Objective:   VITALS:   Vitals:   08/26/21 1445 08/26/21 1643 08/26/21 2004 08/27/21 0500  BP: 118/71 (!) 111/51 119/62 140/73  Pulse: 68 73 72 87  Resp: 18 18 18 18   Temp: 98.4 F (36.9 C) 98.5 F (36.9 C) 97.8 F (36.6 C) 98.1 F (36.7 C)  TempSrc: Oral Oral Oral Oral  SpO2: 100% 100% 100% 98%  Weight:      Height:       CBC Latest Ref Rng & Units 08/26/2021 08/24/2021 08/15/2021  WBC 4.0 - 10.5 K/uL 8.0 6.5 7.1  Hemoglobin 12.0 - 15.0 g/dL 7.9(L) 9.9(L) 7.8(L)  Hematocrit 36.0 - 46.0 % 24.9(L) 31.1(L) 23.4(L)  Platelets 150 - 400 K/uL 313 374 171   BMP Latest Ref Rng & Units 08/27/2021 08/26/2021 08/25/2021  Glucose 70 - 99 mg/dL 08/27/2021) 811(B) 99  BUN 6 - 20 mg/dL 11 7 10   Creatinine 0.44 - 1.00 mg/dL 147(W 2.95  Sodium 135 - 145 mmol/L 137 135 137  Potassium 3.5 - 5.1 mmol/L 4.7 4.0 4.1  Chloride 98 - 111 mmol/L 104 103 105  CO2 22 - 32 mmol/L 24 27 24   Calcium 8.9 - 10.3 mg/dL 6.21) 3.08) )   Intake/Output      08/27 0701 08/28 0700 08/28 0701 08/29 0700   P.O. 390    I.V. (mL/kg) 1779.5 (19.9)    Total Intake(mL/kg) 2169.5 (24.3)    Urine (mL/kg/hr)     Total Output     Net +2169.5         Urine Occurrence 3 x       Physical Exam: General: NAD.  Sitting in bedside chair. Calm Resp: No increased wob Cardio: regular rate and rhythm ABD soft Neurologically intact MSK NVI TTP R knee and calf Calf compressible and mildly soft Dorsiflexion/Plantar flexion intact Extensive ecchymosis RLE Incision: dressings C/D/I   Assessment: 3 Days Post-Op  S/P Procedure(s) (LRB): OPEN REDUCTION INTERNAL FIXATION (ORIF) TIBIAL PLATEAU (Right) REMOVAL EXTERNAL FIXATION LEG (Right) by Dr. 9/28 on 08/24/21  Active Problems:   Closed bicondylar  fracture of right tibial plateau   Plan: Advance diet Up with therapy Incentive Spirometry Elevate and Apply ice  Weightbearing: NWB RLE Insicional and dressing care: Reinforce dressings as needed Orthopedic device(s): Hinge brace needed only when mobilizing with walker, may be off at all other times Showering: Ok to get incisions wet, clean with soap and water VTE prophylaxis:  Eliquis , TED hose RLE, SCDs LLE Pain control: Continue current regimen Follow - up plan: 2 weeks after discharge with Dr. 9/29 information for today:  Carola Frost MD, 08/26/21 PA-C  Dispo: Therapies as tolerated, PT/OT recommending home health. Continue to monitor CBC. Plan for discharge once mobilizing well with therapies    Apolinar Junes Office Margarita Rana 08/27/2021, 8:02 AM

## 2021-08-28 NOTE — Progress Notes (Signed)
Physical Therapy Note  PT session complete with full note to follow;  Session focused on answering pt's questions in anticipation of DC home;  Provided education re: HEP for working on knee flexion ROM and knee strengtheinng; Gave handout;  OK for dc home from PT standpoint   Van Clines, PT  Acute Rehabilitation Services Pager [phone] Office [phone]

## 2021-08-28 NOTE — Progress Notes (Signed)
Occupational Therapy Treatment and Discharge Patient Details Name: Faith Stevens MRN: [id number] DOB: 1961-07-24 Today's Date: 08/28/2021    History of present illness The pt is a 60 yo female presenting s/p ORIF of R bicondylar tibial plateau fx on 8/25. She has been in external fixator since falling 3 foot off of a loading dock while moving a box on 8/12. PMH includes: HTN.   OT comments  This 60 yo female admitted with above seen today to make sure there not any self care tasks she had questions/concerns about before D/C'ing home today. All questions answered and BSC transfer practiced. We also discussed how she can sit in back seat of car and scoot backwards with leg resting on car seat in extension to get to/from appointments instead of needing WC transport services. No further acute OT needs, we will D/C pt.  Follow Up Recommendations  Home health OT;Supervision/Assistance - 24 hour    Equipment Recommendations  None recommended by OT       Precautions / Restrictions Precautions Precautions: Fall Required Braces or Orthoses: Other Brace Other Brace: hinged knee brace (Hinged knee brace only needs to be on when mobilizing otherwise it may be off per MD note) Restrictions Weight Bearing Restrictions: Yes RLE Weight Bearing: Non weight bearing Other Position/Activity Restrictions: unrestricted knee ROM       Mobility Bed Mobility Overal bed mobility: Modified Independent Bed Mobility: Sit to Supine           General bed mobility comments: used belt looped around her foot to help move RLE    Transfers Overall transfer level: Modified independent Equipment used:  (gait belt as leg loop) Transfers: Stand Pivot Transfers;Squat Pivot Transfers Sit to Stand: Modified independent (Device/Increase time) Stand pivot transfers: Modified independent (Device/Increase time)            Balance Overall balance assessment: Needs assistance Sitting-balance support: Feet  supported;No upper extremity supported Sitting balance-Leahy Scale: Good     Standing balance support: Single extremity supported Standing balance-Leahy Scale: Poor                             ADL either performed or assessed with clinical judgement   ADL Overall ADL's : Needs assistance/impaired                         Toilet Transfer: Modified Independent;Stand-pivot;Squat-pivot;BSC Toilet Transfer Details (indicate cue type and reason): bed>BSC using gait belt to help her in moving her RLE           General ADL Comments: Educated pt and friend in room in donning and doffing hinged knee brace. Talked about clothes for dressing to make it as easy as possible. Pt described to me how they had already figured out at home how to safely get her into the shower and can only use the [facility] in room (not over toilet because it is a water closet).     Vision Patient Visual Report: No change from baseline            Cognition Arousal/Alertness: Awake/alert Behavior During Therapy: WFL for tasks assessed/performed Overall Cognitive Status: Within Functional Limits for tasks assessed  Pertinent Vitals/ Pain       Pain Assessment: Faces Faces Pain Scale: Hurts even more Pain Location: R knee--had been moving around alot with PT before I got in room Pain Descriptors / Indicators: Sore;Aching;Guarding Pain Intervention(s): Limited activity within patient's tolerance;Monitored during session;Repositioned         Frequency  Min 2X/week        Progress Toward Goals  OT Goals(current goals can now be found in the care plan section)  Progress towards OT goals: Progressing toward goals  Acute Rehab OT Goals Patient Stated Goal: to go home today OT Goal Formulation: With patient Time For Goal Achievement: 09/08/21 Potential to Achieve Goals: Good  Plan Discharge plan remains appropriate        AM-PAC OT "6 Clicks" Daily Activity     Outcome Measure   Help from another person eating meals?: None Help from another person taking care of personal grooming?: A Little Help from another person toileting, which includes using toliet, bedpan, or urinal?: None Help from another person bathing (including washing, rinsing, drying)?: A Little Help from another person to put on and taking off regular upper body clothing?: None Help from another person to put on and taking off regular lower body clothing?: A Lot 6 Click Score: 20    End of Session    OT Visit Diagnosis: Pain Pain - Right/Left: Right Pain - part of body: Knee   Activity Tolerance Patient tolerated treatment well   Patient Left in bed;with call bell/phone within reach;with family/visitor present   Nurse Communication  (pt has ride coming at 3:00pm)        Time: 4680-3212 OT Time Calculation (min): 34 min  Charges: OT General Charges $OT Visit: 1 Visit OT Treatments $Self Care/Home Management : 23-37 mins  Ignacia Palma, OTR/L Acute [employer] Pager [phone] Office [phone]     Evette Georges 08/28/2021, 5:00 PM

## 2021-08-28 NOTE — Progress Notes (Signed)
Physical Therapy Treatment Patient Details Name: Faith Stevens MRN: [id number] DOB: 1961-10-21 Today's Date: 08/28/2021    History of Present Illness The pt is a 60 yo female presenting s/p ORIF of R bicondylar tibial plateau fx on 8/25. She has been in external fixator since falling 3 foot off of a loading dock while moving a box on 8/12. PMH includes: HTN.    PT Comments    Continuing work on functional mobility and activity tolerance;  session focused on answering pt and friends' questions re: dc'ing home; At this point, she is moving quite at a wheelchair transfer level and is overall managing well enough to go home; Educated pt and friend re: RW use, and while she has been hesitant to use RW and to start trying hop-to -- encouraged pt to try, as it will help with endurance building; Answered questions and gave instructions for knee flexion stretching; provided handout  Follow Up Recommendations  Home health PT;Supervision/Assistance - 24 hour     Equipment Recommendations  Other (comment);None recommended by PT (already well-equipped)    Recommendations for Other Services       Precautions / Restrictions Precautions Precautions: Fall Precaution Comments: in hinged knee brace (declining for transfers due to increased pain with brace donned) Required Braces or Orthoses: Other Brace Other Brace: hinged knee brace (Hinged knee brace only needs to be on when mobilizing otherwise it may be off per MD note) Restrictions Weight Bearing Restrictions: Yes RLE Weight Bearing: Non weight bearing Other Position/Activity Restrictions: unrestricted knee ROM    Mobility  Bed Mobility Overal bed mobility: Modified Independent Bed Mobility: Supine to Sit     Supine to sit: Modified independent (Device/Increase time)     General bed mobility comments: used belt looped around her foot to help move RLE    Transfers Overall transfer level: Modified independent Equipment used:  (gait  belt as leg loop) Transfers: Squat Pivot Transfers Sit to Stand: Modified independent (Device/Increase time) Stand pivot transfers: Modified independent (Device/Increase time) Squat pivot transfers: Supervision     General transfer comment: transferred bed to wheelchair  Ambulation/Gait                 Stairs             Wheelchair Mobility    Modified Rankin (Stroke Patients Only)       Balance Overall balance assessment: Needs assistance Sitting-balance support: Feet supported;No upper extremity supported Sitting balance-Leahy Scale: Good     Standing balance support: Single extremity supported Standing balance-Leahy Scale: Poor                              Cognition Arousal/Alertness: Awake/alert Behavior During Therapy: WFL for tasks assessed/performed Overall Cognitive Status: Within Functional Limits for tasks assessed                                 General Comments: pt pleasant and with good safety understanding      Exercises Total Joint Exercises Goniometric ROM: 0-36 deg Other Exercises Other Exercises: seated knee flexion stretch x3; with education provided re: bending to tolerance, then supporting in that end range bent position with bolster or yoga block; then activating hamstrings to push heel into bolster for active knee flexion Other Exercises: LAQ within available range x10    General Comments General comments (skin integrity, edema, etc.): Lengthy  discussion re: mobility tasks and ADLs necessary to dc home; pt has been managing at home at wheelchair level since original injury      Pertinent Vitals/Pain Pain Assessment: Faces Pain Score: 1  Faces Pain Scale: Hurts even more Pain Location: R Knee Pain Descriptors / Indicators: Sore;Aching;Guarding Pain Intervention(s): Monitored during session    Home Living                      Prior Function            PT Goals (current goals can now  be found in the care plan section) Acute Rehab PT Goals Patient Stated Goal: to go home today PT Goal Formulation: With patient Time For Goal Achievement: 09/08/21 Potential to Achieve Goals: Good Progress towards PT goals: Progressing toward goals    Frequency    Min 4X/week      PT Plan Current plan remains appropriate    Co-evaluation              AM-PAC PT "6 Clicks" Mobility   Outcome Measure  Help needed turning from your back to your side while in a flat bed without using bedrails?: A Little Help needed moving from lying on your back to sitting on the side of a flat bed without using bedrails?: None Help needed moving to and from a bed to a chair (including a wheelchair)?: A Little Help needed standing up from a chair using your arms (e.g., wheelchair or bedside chair)?: A Little Help needed to walk in hospital room?: A Lot Help needed climbing 3-5 steps with a railing? : A Lot 6 Click Score: 17    End of Session Equipment Utilized During Treatment: Gait belt (as leg lifter) Activity Tolerance: Patient tolerated treatment well Patient left: with family/visitor present (in wheelchair, about to work with OT) Nurse Communication: Mobility status PT Visit Diagnosis: Other abnormalities of gait and mobility (R26.89);Pain Pain - Right/Left: Right Pain - part of body: Leg     Time: 1118-1200 PT Time Calculation (min) (ACUTE ONLY): 42 min  Charges:  $Therapeutic Exercise: 23-37 mins $Therapeutic Activity: 8-22 mins                     Van Clines, PT  Acute Rehabilitation Services Pager [phone] Office [phone]    Levi Aland 08/28/2021, 6:00 PM

## 2021-08-28 NOTE — Progress Notes (Signed)
Orthopaedic Trauma Service Progress Note  Patient ID: Faith Stevens MRN: [id number] DOB/AGE: 1961-11-01 60 y.o.  Subjective:  Doing well Ready to go home  Working with therapy this am   ROS As above  Objective:   VITALS:   Vitals:   08/27/21 1947 08/28/21 0015 08/28/21 0424 08/28/21 0730  BP: 113/68 (!) 106/49 112/65 136/65  Pulse: 81 68 62 79  Resp: 18 15 18 16   Temp: 98.7 F (37.1 C) 97.7 F (36.5 C) 98.1 F (36.7 C) 98.3 F (36.8 C)  TempSrc: Oral Oral Oral Oral  SpO2: 100% 100% 98% 97%  Weight:      Height:        Estimated body mass index is 33.81 kg/m as calculated from the following:   Height as of this encounter: 5\' 4"  (1.626 m).   Weight as of this encounter: 89.4 kg.   Intake/Output      08/28 0701 08/29 0700 08/29 0701 08/30 0700   P.O. 460    I.V. (mL/kg) 300 (3.4)    Total Intake(mL/kg) 760 (8.5)    Urine (mL/kg/hr)  600 (1.4)   Total Output  600   Net +760 -600        Urine Occurrence 2 x      LABS  No results found for this or any previous visit (from the past 24 hour(s)).   PHYSICAL EXAM:   Gen: Resting comfortably in bed, no acute distress, appears well Lungs: Unlabored Cardiac: Regular Ext:       Right lower extremity             Dressing is clean, dry and intact             Extremity is warm             Swelling is controlled             Knee is resting in full extension.  Blankets/foam are under the ankle.    TED hose in place             DPN, SPN, TN sensory functions intact             EHL, FHL, lesser toe motor functions intact.             Ankle flexion, extension, inversion eversion intact             Compartments are soft.  No pain out of proportion with passive stretching             + DP pulse    Assessment/Plan: 4 Days Post-Op   Principal Problem:   Closed bicondylar fracture of right tibial plateau Active Problems:   Essential  hypertension   Anxiety   Anti-infectives (From admission, onward)    Start     Dose/Rate Route Frequency Ordered Stop   08/24/21 1530  ceFAZolin (ANCEF) IVPB 2g/100 mL premix        2 g 200 mL/hr over 30 Minutes Intravenous Every 6 hours 08/24/21 1453 08/25/21 0600   08/24/21 0600  ceFAZolin (ANCEF) IVPB 2g/100 mL premix        2 g 200 mL/hr over 30 Minutes Intravenous On call to O.R. 08/24/21 08/26/21 08/24/21 0827     .  POD/HD#: 39  60 year old female fall approximately 2 weeks  ago with complex bicondylar right tibial plateau fracture.  Treated initially with external fixation now s/p ORIF and removal of fixator   -Comminuted right bicondylar tibial plateau fracture s/p ORIF             Nonweightbearing right leg x 8 weeks             Unrestricted range of motion right knee                         Hinged knee brace only needs to be on when mobilizing otherwise it may be off                         Knee is to be resting in full extension.  Can dangle off the bed or chair.  Do not place any pillows/blankets under the bend of the knee             Ice and elevate for swelling and pain control             Dressing changes daily as needed   Ok to clean wound with soap and water only   Ok to leave incisions/wounds open to the air once dried                PT and OT evaluations   PT- please teach HEP for right knee ROM- AROM, PROM. Prone exercises as well. No ROM restrictions.  Quad sets, SLR, LAQ, SAQ, heel slides, stretching, prone flexion and extension   Ankle theraband program, heel cord stretching, toe towel curls, etc   No pillows under bend of knee when at rest, ok to place under heel to help work on extension. Can also use zero knee bone foam if available     - Pain management:             Multimodal   - ABL anemia/Hemodynamics             Stable          Asymptomatic    - Medical issues              HTN                         [place] meds    - DVT/PE prophylaxis:              eliquis x 4 weeks    - ID:              Periop abx completed   - Metabolic Bone Disease:             Vitamin d levels were good on last admission              Continue home supplementation    - Activity:             NWB R Leg o/w activity as tolerated   - FEN/GI prophylaxis/Foley/Lines:             dc IV and IVF   - Dispo:             dc home today   Follow up in 10-14 days   Mearl Latin, PA-C [phone] (C) 08/28/2021, 11:54 AM  [facility] [address] [place] [address] [phone] [phone] (F)    After 5pm and on the weekends please log on  to Amion, go to orthopaedics and the look under the [facility] Call for the provider(s) on call. You can also call our office at [phone] and then follow the prompts to be connected to the call team.

## 2021-08-28 NOTE — TOC Initial Note (Addendum)
Transition of Care [facility]) - Initial/Assessment Note    Patient Details  Name: Faith Stevens MRN: [id number] Date of Birth: [birth year]  [facility] CM/SW Contact:    Kingsley Plan, RN Phone Number: 08/28/2021, 11:20 AM  Clinical Narrative:                 Spoke to patient and her assistant Alisa via phone.   Patient in town for furniture market and was injured. Case is not workers comp.   Patient will be staying at [address] [place], [place] [address].   PT recommending home health PT.   NCM unable to find an agency to accept referral with patient's insurance [employer]. Serina Cowper states patient willing to private pay for HHPT/OT.  NCM has asked [place] with [facility] , awaiting determination. Cory unable   [place] with [facility] checking to see if she can accept insurance or private pay. Pearson Grippe unable   [place] with [facility] is unable to accept insurance or private pay   [facility] does not have HHPT yet.   [facility], talked to [place], they do not provide HHPT or HHOT.  Marylene Land with [facility] left message. Marylene Land unable   [place] with [facility] checking to see if she can accept insurance or private pay.Stacie unable   [place] with [facility] unable to accept insurance or private pay.  [place] with [facility] unable to accept    [place] with [facility] unable to accept insurance or private pay.  Elnita Maxwell with [facility] unable to accept insurance or private pay.  [place] with [facility] unable to accept insurance or private pay.  Spoke to [place] at [facility] they can accept for private pay. Patient will need to pay up front for first week of visits, visits $125.00 per visit. NCM secure chatted Renae Fickle PA the first week patient will need 2 HHPT visits and 1 OT visit. NCM called Alisa and explained, she is in agreement. Erica with [facility] will call Alisa directly [phone] to discuss cost patyment etc.   They are  requesting ambulance transport home but do not want [facility]. NCM will call [facility]. Secure chatted nurse for what time patient will be ready  Patient discharged. Alisa requesting stretcher transportation today at 3 pm she does not want [facility]. Explained NCM will call [facility]  and request 3 pm pick up.  Called [facility], made request. They use [facility] , [facility] or [facility], they will call [facility] or [facility],and see if they are available, awaiting call back. [facility] returned call , they are unable to find a Scientist, product/process development available for today. They will need to call [facility]. NCM called Alisa and explained. She wants to make some phone calls to see if she can arrange transportation and will call NCM back.     Alisa has arranged [facility] transportation to be here at 3 pm. Nurse aware.   Expected Discharge Plan: Home w Home Health Services     Patient Goals and CMS Choice   CMS Medicare.gov Compare Post Acute Care list provided to:: Patient Choice offered to / list presented to : Patient (assistant Serina Cowper)  Expected Discharge Plan and Services Expected Discharge Plan: Home w Home Health Services   Discharge Planning Services: CM Consult Post Acute Care Choice: Home Health                     DME Agency:  NA                  Prior Living Arrangements/Services                       Activities of Daily Living Home Assistive Devices/Equipment: None ADL Screening (condition at time of admission) Patient's cognitive ability adequate to safely complete daily activities?: Yes Is the patient deaf or have difficulty hearing?: No Does the patient have difficulty seeing, even when wearing glasses/contacts?: No Does the patient have difficulty concentrating, remembering, or making decisions?: No Patient able to express need for assistance with ADLs?: Yes Does the patient have difficulty dressing or bathing?:  Yes Independently performs ADLs?: No Communication: Independent Is this a change from baseline?: Change from baseline, expected to last >3 days Dressing (OT): Needs assistance Is this a change from baseline?: Change from baseline, expected to last >3 days Grooming: Independent Feeding: Independent Bathing: Needs assistance Is this a change from baseline?: Change from baseline, expected to last >3 days Toileting: Needs assistance Is this a change from baseline?: Change from baseline, expected to last >3days In/Out Bed: Needs assistance Is this a change from baseline?: Change from baseline, expected to last >3 days Walks in Home: Independent Does the patient have difficulty walking or climbing stairs?: Yes Weakness of Legs: None Weakness of Arms/Hands: None  Permission Sought/Granted                  Emotional Assessment              Admission diagnosis:  Closed bicondylar fracture of right tibial plateau [S82.141A] Patient Active Problem List   Diagnosis Date Noted   Essential hypertension 08/14/2021   Anxiety 08/14/2021   Closed bicondylar fracture of right tibial plateau 08/11/2021   PCP:  Pcp, No Pharmacy:   [id number] [place], [place] - [address] AT [facility] OF [address] [place] [address] Phone: [phone] Fax: [phone]  [facility] [address] [place] [address] Phone: [phone] Fax: [phone]     Social Determinants of Health (SDOH) Interventions    Readmission Risk Interventions No flowsheet data found.

## 2021-08-28 NOTE — Discharge Summary (Signed)
[facility] (OTS) Discharge Summary   Patient ID: Faith Stevens MRN: [id number] DOB/AGE: 01-16-1961 60 y.o.  Admit date: 08/24/2021 Discharge date: 08/28/2021  Admission Diagnoses: Closed right bicondylar tibial plateau fracture with retained external fixator Hypertension Anxiety  Discharge Diagnoses:  Principal Problem:   Closed bicondylar fracture of right tibial plateau Active Problems:   Essential hypertension   Anxiety   Past Medical History:  Diagnosis Date   Anxiety 08/14/2021   Essential hypertension 08/14/2021   HTN (hypertension) with goal to be determined      Procedures Performed: 08/24/2021-Dr. Handy 1. ORIF OF RIGHT BICONDYLAR TIBIAL PLATEAU FRACTURE  2. ARTHROTOMY WITH PARTIAL MENISCECTOMY RIGHT LATERAL MENISCUS 4. ANTERIOR COMPARTMENT FASCIOTOMY 5. REMOVAL OF EXTERNAL FIXATOR UNDER ANESTHESIA 6. CURETTAGE OF ULCERATED PIN SITES INCLUDING BONE OF TIBIA AND FEMUR    Discharged Condition: good  Hospital Course:   Very pleasant 60 year old female who sustained a complex bicondylar tibial plateau fracture on 08/11/2021.  She was treated in a staged fashion due to severe soft tissue swelling.  She was placed into an external fixator with close follow-up as we awaited soft tissue swelling resolution.  This was accomplished in expected window.  Patient was brought back to the hospital on 08/24/2021 for the procedure noted above.  After surgery patient was taken to PACU for recovery from anesthesia and then transferred to the medical surgical floor for continued observation, pain control and therapies.  Patient's hospital stay was uncomplicated.  Did not experience any unexpected issues.  As expected pain control was a little bit of a challenge given the magnitude of her injury and required surgery.  Despite this patient progressed very well.  She work with therapies daily and ultimately on 08/28/2021 was deemed stable for discharge to home with  home health services.  Patient was covered with Lovenox initially from her index surgery until this most recent admission.  After definitive surgery she was then transitioned to Eliquis 2.5 mg every 12 hours for the next 4 weeks for DVT and PE prophylaxis.  Her dressing was changed on postoperative day #2 incisions look fantastic and a TED hose was placed for swelling control.  Patient was covered with Ancef for perioperative antibiosis.  We did check vitamin D levels during her initial admission and her vitamin D levels were appropriate.  She is going to continue on her home vitamin D dosage.  Patient discharged in stable condition on 08/28/2021.  She will follow-up with orthopedics in 10 to 14 days for follow-up x-rays and suture removal.  She will continue to work on aggressive knee range of motion continue to focus on getting full extension as soon as possible.  Goal of 90 degrees of flexion by 4 weeks postoperatively.  All range of motion exercises have been reviewed with the patient.  Again she is strict nonweightbearing on her right leg for the next 8 weeks   Consults: None  Significant Diagnostic Studies: labs:   Results for FATIME, BISWELL (MRN [id number]) as of 08/28/2021 11:50  Ref. Range 08/26/2021 02:59 08/27/2021 01:08  Sodium Latest Ref Range: 135 - 145 mmol/L 135 137  Potassium Latest Ref Range: 3.5 - 5.1 mmol/L 4.0 4.7  Chloride Latest Ref Range: 98 - 111 mmol/L 103 104  CO2 Latest Ref Range: 22 - 32 mmol/L 27 24  Glucose Latest Ref Range: 70 - 99 mg/dL 782 (H) 956 (H)  BUN Latest Ref Range: 6 - 20 mg/dL 7 11  Creatinine Latest Ref Range: 0.44 - 1.00  mg/dL 1.61 0.96  Calcium Latest Ref Range: 8.9 - 10.3 mg/dL 8.6 (L) 8.7 (L)  Anion gap Latest Ref Range: 5 - GFR, Estimated Latest Ref Range: >60 mL/min >60 >60  WBC Latest Ref Range: 4.0 - 10.5 K/uL 8.0   RBC Latest Ref Range: 3.87 - 5.11 MIL/uL 2.55 (L)   Hemoglobin Latest Ref Range: 12.0 - 15.0 g/dL 7.9 (L)   HCT  Latest Ref Range: 36.0 - 46.0 % 24.9 (L)   MCV Latest Ref Range: 80.0 - 100.0 fL 97.6   MCH Latest Ref Range: 26.0 - 34.0 pg 31.0   MCHC Latest Ref Range: 30.0 - 36.0 g/dL 04.5   RDW Latest Ref Range: 11.5 - 15.5 % 15.8 (H)   Platelets Latest Ref Range: 150 - 400 K/uL 313   nRBC Latest Ref Range: 0.0 - 0.2 % 0.0     Treatments: IV hydration, antibiotics: Ancef, analgesia: acetaminophen, Dilaudid, and oxycodone, anticoagulation: eliquis, therapies: PT, OT, and RN, and surgery: as above   Discharge Exam:            Orthopaedic Trauma Service Progress Note   Patient ID: Lorianne Malbrough MRN: [id number] DOB/AGE: 08/06/1961 60 y.o.   Subjective:   Doing well Ready to go home  Working with therapy this am    ROS As above   Objective:    VITALS:         Vitals:    08/27/21 1947 08/28/21 0015 08/28/21 0424 08/28/21 0730  BP: 113/68 (!) 106/49 112/65 136/65  Pulse: 81 68 62 79  Resp: Temp: 98.7 F (37.1 C) 97.7 F (36.5 C) 98.1 F (36.7 C) 98.3 F (36.8 C)  TempSrc: Oral Oral Oral Oral  SpO2: 100% 100% 98% 97%  Weight:          Height:              Estimated body mass index is 33.81 kg/m as calculated from the following:   Height as of this encounter:  (1.626 m).   Weight as of this encounter: 89.4 kg.     Intake/Output      08/28 0701 08/29 0700 08/29 0701 08/30 0700   P.O. 460    I.V. (mL/kg) 300 (3.4)    Total Intake(mL/kg) 760 (8.5)    Urine (mL/kg/hr)  600 (1.4)   Total Output  600   Net +760 -600        Urine Occurrence 2 x       LABS   Lab Results Last 24 Hours  No results found for this or any previous visit (from the past 24 hour(s)).       PHYSICAL EXAM:    Gen: Resting comfortably in bed, no acute distress, appears well Lungs: Unlabored Cardiac: Regular Ext:       Right lower extremity             Dressing is clean, dry and intact             Extremity is warm             Swelling is controlled             Knee is  resting in full extension.  Blankets/foam are under the ankle.               TED hose in place             DPN, SPN,  TN sensory functions intact             EHL, FHL, lesser toe motor functions intact.             Ankle flexion, extension, inversion eversion intact             Compartments are soft.  No pain out of proportion with passive stretching             + DP pulse     Assessment/Plan: 4 Days Post-Op    Principal Problem:   Closed bicondylar fracture of right tibial plateau Active Problems:   Essential hypertension   Anxiety     Anti-infectives (From admission, onward)        Start     Dose/Rate Route Frequency Ordered Stop    08/24/21 1530   ceFAZolin (ANCEF) IVPB 2g/100 mL premix        2 g 200 mL/hr over 30 Minutes Intravenous Every 6 hours 08/24/21 1453 08/25/21 0600    08/24/21 0600   ceFAZolin (ANCEF) IVPB 2g/100 mL premix        2 g 200 mL/hr over 30 Minutes Intravenous On call to O.R. 08/24/21 [id number] 08/24/21 0827         .   POD/HD#: 404   60 year old female fall approximately 2 weeks ago with complex bicondylar right tibial plateau fracture.  Treated initially with external fixation now s/p ORIF and removal of fixator   -Comminuted right bicondylar tibial plateau fracture s/p ORIF             Nonweightbearing right leg x 8 weeks             Unrestricted range of motion right knee                         Hinged knee brace only needs to be on when mobilizing otherwise it may be off                         Knee is to be resting in full extension.  Can dangle off the bed or chair.  Do not place any pillows/blankets under the bend of the knee             Ice and elevate for swelling and pain control             Dressing changes daily as needed                         Ok to clean wound with soap and water only                         Ok to leave incisions/wounds open to the air once dried                PT and OT evaluations   PT- please teach HEP for right  knee ROM- AROM, PROM. Prone exercises as well. No ROM restrictions.  Quad sets, SLR, LAQ, SAQ, heel slides, stretching, prone flexion and extension   Ankle theraband program, heel cord stretching, toe towel curls, etc   No pillows under bend of knee when at rest, ok to place under heel to help work on extension. Can also use zero knee bone foam if available     - Pain management:  Multimodal   - ABL anemia/Hemodynamics             Stable                                Asymptomatic    - Medical issues              HTN                         Nome meds    - DVT/PE prophylaxis:             eliquis x 4 weeks    - ID:              Periop abx completed   - Metabolic Bone Disease:             Vitamin d levels were good on last admission              Continue home supplementation    - Activity:             NWB R Leg o/w activity as tolerated   - FEN/GI prophylaxis/Foley/Lines:             dc IV and IVF   - Dispo:             dc home today              Follow up in 10-14 days   Disposition: Discharge disposition: 01-Home or Self Care       Discharge Instructions     Call MD / Call 911   Complete by: As directed    If you experience chest pain or shortness of breath, CALL 911 and be transported to the hospital emergency room.  If you develope a fever above 101 F, pus (white drainage) or increased drainage or redness at the wound, or calf pain, call your surgeon's office.   Constipation Prevention   Complete by: As directed    Drink plenty of fluids.  Prune juice may be helpful.  You may use a stool softener, such as Colace (over the counter) 100 mg twice a day.  Use MiraLax (over the counter) for constipation as needed.   Diet general   Complete by: As directed    Discharge instructions   Complete by: As directed    Orthopaedic Trauma Service Discharge Instructions   General Discharge Instructions  Orthopaedic Injuries:  Right tibial plateau fracture  treated with open reduction internal fixation using plate and screws  WEIGHT BEARING STATUS: Nonweightbearing right leg.  RANGE OF MOTION/ACTIVITY: Unrestricted range of motion right knee.  Activity as tolerated while maintaining weightbearing restrictions.  Continue to perform exercises that therapy taught you several times a day.  Do not let a pillow or blanket rest under the bend of the knee when at rest.  Knee is either fully straight or although a bent (90 degrees)  Bone health: continue with home vitamin d  Wound Care:  Discharge Wound Care Instructions  Do NOT apply any ointments, solutions or lotions to pin sites or surgical wounds.  These prevent needed drainage and even though solutions like hydrogen peroxide kill bacteria, they also damage cells lining the pin sites that help fight infection.  Applying lotions or ointments can keep the wounds moist and can cause them to breakdown and open up as well. This can increase the risk  for infection. When in doubt call the office.  Surgical incisions should be dressed daily.  If any drainage is noted, use one layer of adaptic, then gauze, Kerlix, and an ace wrap.  Alternatively you can use a Mepilex type dressing which is the beige dressing you have on.  You may also use a compression sock instead of an Ace wrap  Once the incision is completely dry and without drainage, it may be left open to air out.  Showering may begin 36-48 hours later.  Cleaning gently with soap and water.    DVT/PE prophylaxis:eliquis 2.5 mg tablet every 12 hours x 4 weeks   Diet: as you were eating previously.  Can use over the counter stool softeners and bowel preparations, such as Miralax, to help with bowel movements.  Narcotics can be constipating.  Be sure to drink plenty of fluids  PAIN MEDICATION USE AND EXPECTATIONS  You have likely been given narcotic medications to help control your pain.  After a traumatic event that results in an fracture (broken bone)  with or without surgery, it is ok to use narcotic pain medications to help control one's pain.  We understand that everyone responds to pain differently and each individual patient will be evaluated on a regular basis for the continued need for narcotic medications. Ideally, narcotic medication use should last no more than 6-8 weeks (coinciding with fracture healing).   As a patient it is your responsibility as well to monitor narcotic medication use and report the amount and frequency you use these medications when you come to your office visit.   We would also advise that if you are using narcotic medications, you should take a dose prior to therapy to maximize you participation.  IF YOU ARE ON NARCOTIC MEDICATIONS IT IS NOT PERMISSIBLE TO OPERATE A MOTOR VEHICLE (MOTORCYCLE/CAR/TRUCK/MOPED) OR HEAVY MACHINERY DO NOT MIX NARCOTICS WITH OTHER CNS (CENTRAL NERVOUS SYSTEM) DEPRESSANTS SUCH AS ALCOHOL   POST-OPERATIVE OPIOID TAPER INSTRUCTIONS:  It is important to wean off of your opioid medication as soon as possible. If you do not need pain medication after your surgery it is ok to stop day one.  Opioids include:  o Codeine, Hydrocodone(Norco, Vicodin), Oxycodone(Percocet, oxycontin) and hydromorphone amongst others.   Long term and even short term use of opiods can cause:  o Increased pain response  o Dependence  o Constipation  o Depression  o Respiratory depression  o And more.   Withdrawal symptoms can include  o Flu like symptoms  o Nausea, vomiting  o And more  Techniques to manage these symptoms  o Hydrate well  o Eat regular healthy meals  o Stay active  o Use relaxation techniques(deep breathing, meditating, yoga)  Do Not substitute Alcohol to help with tapering  If you have been on opioids for less than two weeks and do not have pain than it is ok to stop all together.   Plan to wean off of opioids  o This plan should start within one week post op of your fracture  surgery   o Maintain the same interval or time between taking each dose and first decrease the dose.   o Cut the total daily intake of opioids by one tablet each day  o Next start to increase the time between doses.  o The last dose that should be eliminated is the evening dose.    STOP SMOKING OR USING NICOTINE PRODUCTS!!!!  As discussed nicotine severely impairs your body's ability to heal  surgical and traumatic wounds but also impairs bone healing.  Wounds and bone heal by forming microscopic blood vessels (angiogenesis) and nicotine is a vasoconstrictor (essentially, shrinks blood vessels).  Therefore, if vasoconstriction occurs to these microscopic blood vessels they essentially disappear and are unable to deliver necessary nutrients to the healing tissue.  This is one modifiable factor that you can do to dramatically increase your chances of healing your injury.    (This means no smoking, no nicotine gum, patches, etc)  DO NOT USE NONSTEROIDAL ANTI-INFLAMMATORY DRUGS (NSAID'S)  Using products such as Advil (ibuprofen), Aleve (naproxen), Motrin (ibuprofen) for additional pain control during fracture healing can delay and/or prevent the healing response.  If you would like to take over the counter (OTC) medication, Tylenol (acetaminophen) is ok.  However, some narcotic medications that are given for pain control contain acetaminophen as well. Therefore, you should not exceed more than 4000 mg of tylenol in a day if you do not have liver disease.  Also note that there are may OTC medicines, such as cold medicines and allergy medicines that my contain tylenol as well.  If you have any questions about medications and/or interactions please ask your doctor/PA or your pharmacist.      ICE AND ELEVATE INJURED/OPERATIVE EXTREMITY  Using ice and elevating the injured extremity above your heart can help with swelling and pain control.  Icing in a pulsatile fashion, such as 20 minutes on and 20 minutes  off, can be followed.    Do not place ice directly on skin. Make sure there is a barrier between to skin and the ice pack.    Using frozen items such as frozen peas works well as the conform nicely to the are that needs to be iced.  USE AN ACE WRAP OR TED HOSE FOR SWELLING CONTROL  In addition to icing and elevation, Ace wraps or TED hose are used to help limit and resolve swelling.  It is recommended to use Ace wraps or TED hose until you are informed to stop.    When using Ace Wraps start the wrapping distally (farthest away from the body) and wrap proximally (closer to the body)   Example: If you had surgery on your leg or thing and you do not have a splint on, start the ace wrap at the toes and work your way up to the thigh        If you had surgery on your upper extremity and do not have a splint on, start the ace wrap at your fingers and work your way up to the upper arm  IF YOU ARE IN A SPLINT OR CAST DO NOT REMOVE IT FOR ANY REASON   If your splint gets wet for any reason please contact the office immediately. You may shower in your splint or cast as long as you keep it dry.  This can be done by wrapping in a cast cover or garbage back (or similar)  Do Not stick any thing down your splint or cast such as pencils, money, or hangers to try and scratch yourself with.  If you feel itchy take benadryl as prescribed on the bottle for itching  IF YOU ARE IN A CAM BOOT (BLACK BOOT)  You may remove boot periodically. Perform daily dressing changes as noted below.  Wash the liner of the boot regularly and wear a sock when wearing the boot. It is recommended that you sleep in the boot until told otherwise    Call  office for the following: ? Temperature greater than 101F ? Persistent nausea and vomiting ? Severe uncontrolled pain ? Redness, tenderness, or signs of infection (pain, swelling, redness, odor or green/yellow discharge around the site) ? Difficulty breathing, headache or visual  disturbances ? Hives ? Persistent dizziness or light-headedness ? Extreme fatigue ? Any other questions or concerns you may have after discharge  In an emergency, call 911 or go to an Emergency Department at a nearby hospital  HELPFUL INFORMATION  ? If you had a block, it will wear off between 8-24 hrs postop typically.  This is period when your pain may go from nearly zero to the pain you would have had postop without the block.  This is an abrupt transition but nothing dangerous is happening.  You may take an extra dose of narcotic when this happens.  ? You should wean off your narcotic medicines as soon as you are able.  Most patients will be off or using minimal narcotics before their first postop appointment.   ? We suggest you use the pain medication the first night prior to going to bed, in order to ease any pain when the anesthesia wears off. You should avoid taking pain medications on an empty stomach as it will make you nauseous.  ? Do not drink alcoholic beverages or take illicit drugs when taking pain medications.  ? In most states it is against the law to drive while you are in a splint or sling.  And certainly against the law to drive while taking narcotics.  ? You may return to work/school in the next couple of days when you feel up to it.   ? Pain medication may make you constipated.  Below are a few solutions to try in this order:   ? Decrease the amount of pain medication if you aren't having pain.   ? Drink lots of decaffeinated fluids.   ? Drink prune juice and/or each dried prunes   o If the first 3 don't work start with additional solutions   ? Take Colace - an over-the-counter stool softener   ? Take Senokot - an over-the-counter laxative   ? Take Miralax - a stronger over-the-counter laxative     CALL THE OFFICE WITH ANY QUESTIONS OR CONCERNS: [phone]   VISIT OUR WEBSITE FOR ADDITIONAL INFORMATION: [email or web address]   Do not put a pillow under the  knee. Place it under the heel.   Complete by: As directed    Increase activity slowly as tolerated   Complete by: As directed    Non weight bearing   Complete by: As directed    Laterality: right   Extremity: Lower   Post-operative opioid taper instructions:   Complete by: As directed    POST-OPERATIVE OPIOID TAPER INSTRUCTIONS: It is important to wean off of your opioid medication as soon as possible. If you do not need pain medication after your surgery it is ok to stop day one. Opioids include: Codeine, Hydrocodone(Norco, Vicodin), Oxycodone(Percocet, oxycontin) and hydromorphone amongst others.  Long term and even short term use of opiods can cause: Increased pain response Dependence Constipation Depression Respiratory depression And more.  Withdrawal symptoms can include Flu like symptoms Nausea, vomiting And more Techniques to manage these symptoms Hydrate well Eat regular healthy meals Stay active Use relaxation techniques(deep breathing, meditating, yoga) Do Not substitute Alcohol to help with tapering If you have been on opioids for less than two weeks and do not have pain than it is ok  to stop all together.  Plan to wean off of opioids This plan should start within one week post op of your joint replacement. Maintain the same interval or time between taking each dose and first decrease the dose.  Cut the total daily intake of opioids by one tablet each day Next start to increase the time between doses. The last dose that should be eliminated is the evening dose.         Allergies as of 08/28/2021       Reactions   Latex Swelling        Medication List     TAKE these medications    acetaminophen 325 MG tablet Commonly known as: TYLENOL Take 2 tablets (650 mg total) by mouth every 12 (twelve) hours.   ALPRAZolam 1 MG tablet Commonly known as: XANAX Take 0.5 mg by mouth daily as needed for anxiety.   apixaban 2.5 MG Tabs tablet Commonly known as:  ELIQUIS Take 1 tablet (2.5 mg total) by mouth 2 (two) times daily.   docusate sodium 100 MG capsule Commonly known as: COLACE Take 1 capsule (100 mg total) by mouth 2 (two) times daily.   methocarbamol 500 MG tablet Commonly known as: ROBAXIN Take 1-2 tablets (500-1,000 mg total) by mouth every 8 (eight) hours as needed for muscle spasms.   Nebivolol HCl 20 MG Tabs Take 20 mg by mouth every evening.   olmesartan 20 MG tablet Commonly known as: BENICAR Take 20 mg by mouth daily.   ondansetron 4 MG disintegrating tablet Commonly known as: Zofran ODT Take 1 tablet (4 mg total) by mouth every 8 (eight) hours as needed for nausea or vomiting.   oxyCODONE-acetaminophen 5-325 MG tablet Commonly known as: Percocet Take 1-2 tablets by mouth every 6 (six) hours as needed for severe pain.   VITAMIN B-12 PO Take 1 tablet by mouth daily.   VITAMIN D3 PO Take 1 tablet by mouth daily.               Discharge Care Instructions  (From admission, onward)           Start     Ordered   08/28/21 0000  Non weight bearing       Question Answer Comment  Laterality right   Extremity Lower      08/28/21 1159            Follow-up Information     Myrene Galas, MD. Schedule an appointment as soon as possible for a visit in 2 week(s).   Specialty: Orthopedic Surgery Contact information: [address] [place] [address] [phone]                 Discharge Instructions and Plan:  60 year old female fall approximately 2 weeks ago with complex bicondylar right tibial plateau fracture.  Treated initially with external fixation now s/p ORIF and removal of fixator  Weightbearing: NWB RLE Insicional and dressing care: Daily dressing changes with mepilex or 4x4s/tape and TED hose Orthopedic device(s):  wheelchair and walker Showering: ok to shower and clean with soap and water only VTE prophylaxis:  eliquis 2.5 mg po BID x 4 weeks   . Pain control:  multimodal: percocet, robaxin Bone Health/Optimization: continue with home vitamin d regimen Follow - up plan: 2 weeks Contact information:  Myrene Galas MD, Montez Morita PA-C   Signed:  Mearl Latin, PA-C [phone] (C) 08/28/2021, 12:00 PM  [facility] [address] [place] [address] [phone] (  O) [phone] (F)

## 2021-08-28 NOTE — Progress Notes (Signed)
[name] to be D/C'd home with home health per MD order. Discussed with the patient and all questions fully answered.  Skin clean and dry. Dressings intact on right leg. Extra dressing supplied sent with patient. IV catheter discontinued intact. Site without signs and symptoms of complications. Dressing and pressure applied.  An After Visit Summary was printed and given to the patient.  Patient escorted via WC, and D/C home via [employer].  Jon Gills  08/28/2021 3:05 PM

## 2021-10-28 IMAGING — CT CT HEAD W/O CM
3 of 4 series · 16 of 47 positions shown, 19 images · non-contrast
Comparison: None.

CLINICAL DATA: Recent fall with head injury, initial encounter

EXAM:
CT HEAD WITHOUT CONTRAST
TECHNIQUE: Contiguous axial images were obtained from the base of the skull
through the vertex without intravenous contrast.

[Series 3: head 2.0 h70h · axial · 0.41mm/px · z∈[-89,+51]mm · 10 of 78 slices shown, 13 images]
[im 4/78  brain]
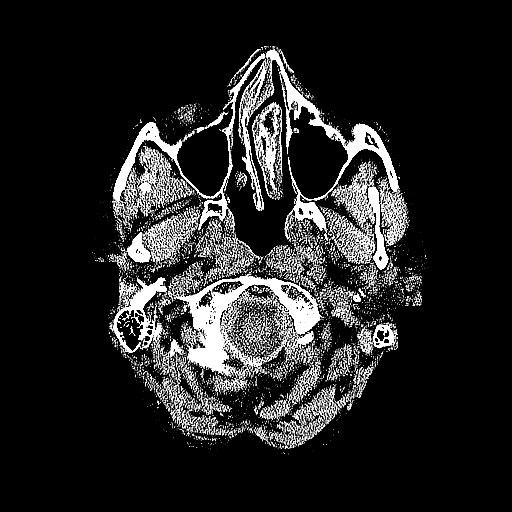
[im 4/78  bone]
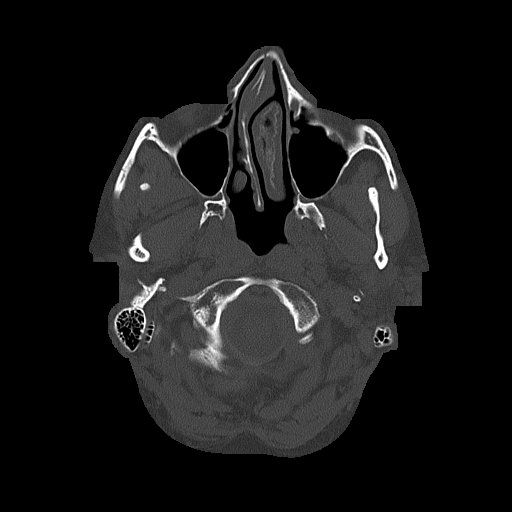
[im 12/78  brain]
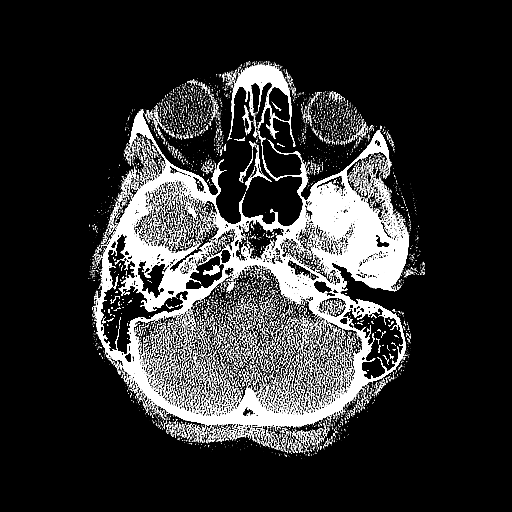
[im 20/78  brain]
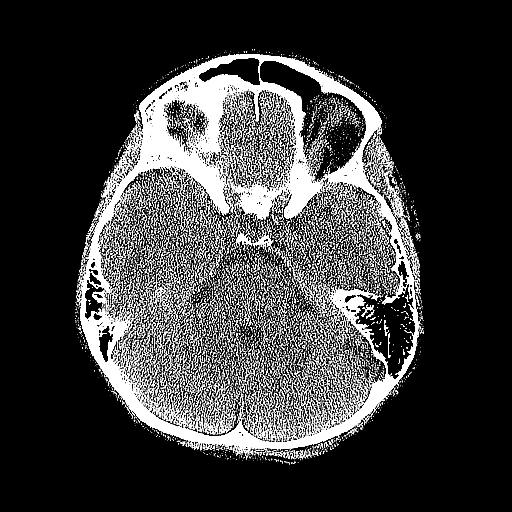
[im 27/78  brain]
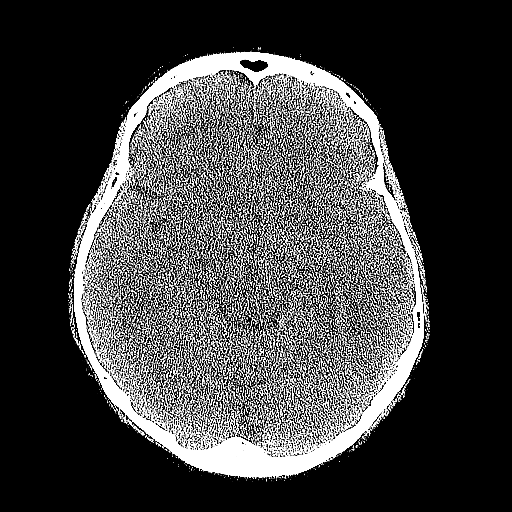
[im 35/78  brain]
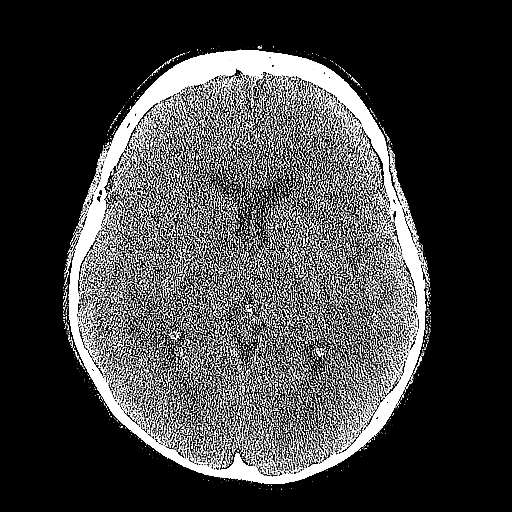
[im 35/78  bone]
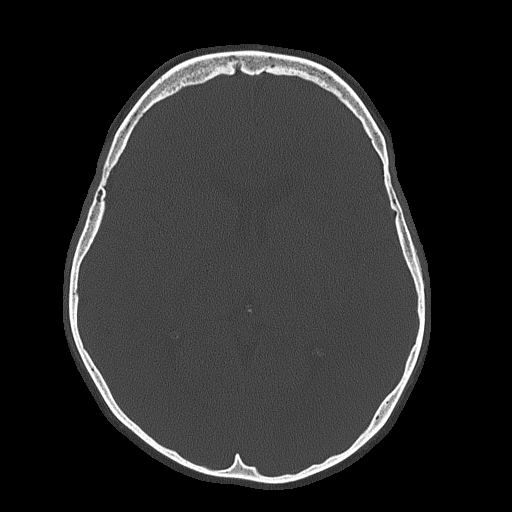
[im 43/78  brain]
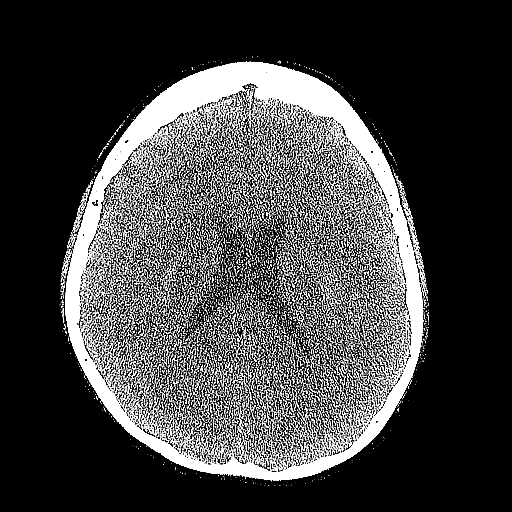
[im 51/78  brain]
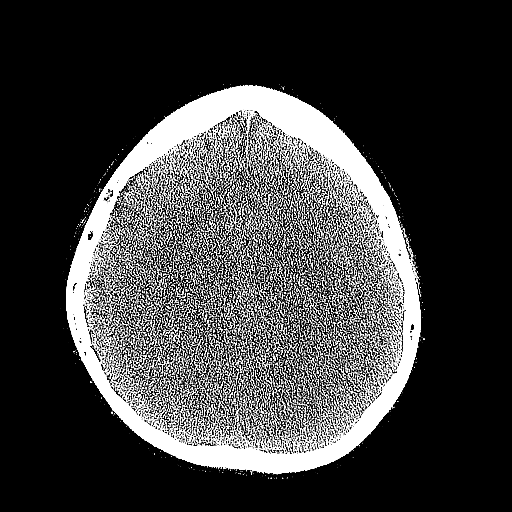
[im 58/78  brain]
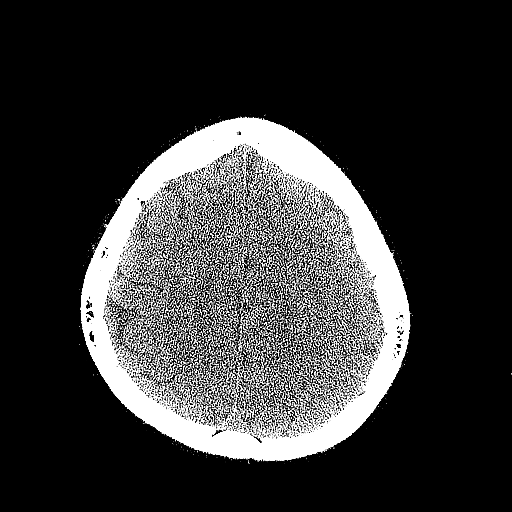
[im 66/78  brain]
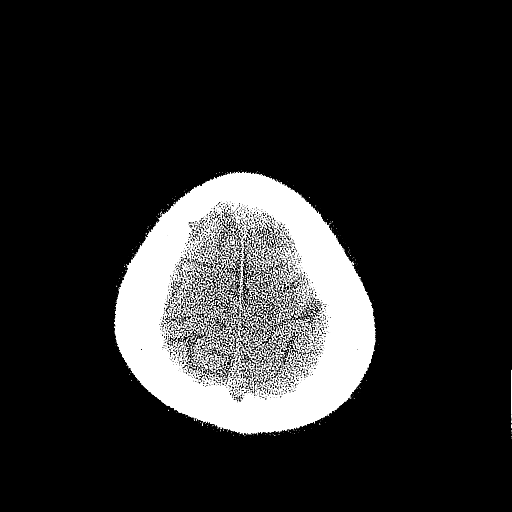
[im 66/78  bone]
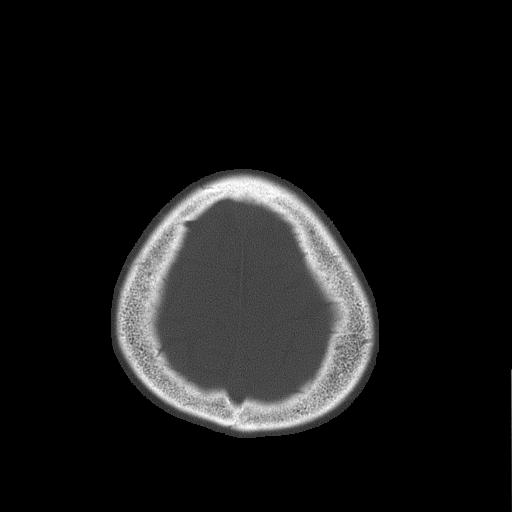
[im 74/78  brain]
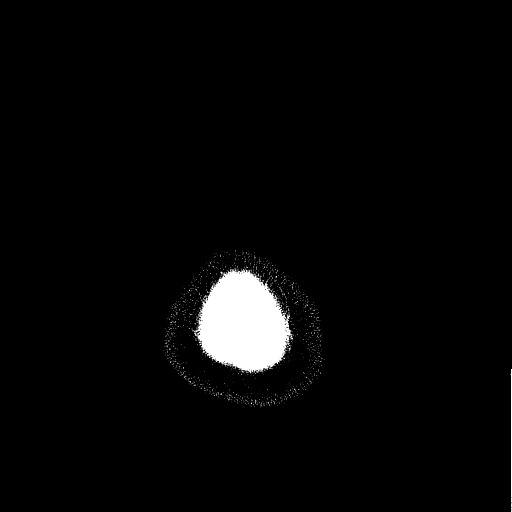

[Series 5: head 3.0 mpr cor · coronal · 0.33mm/px · 3 of 67 slices shown]
[im 23/67  brain]
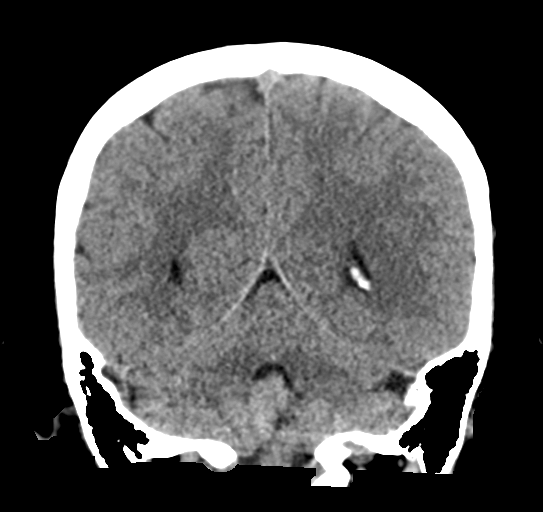
[im 30/67  brain]
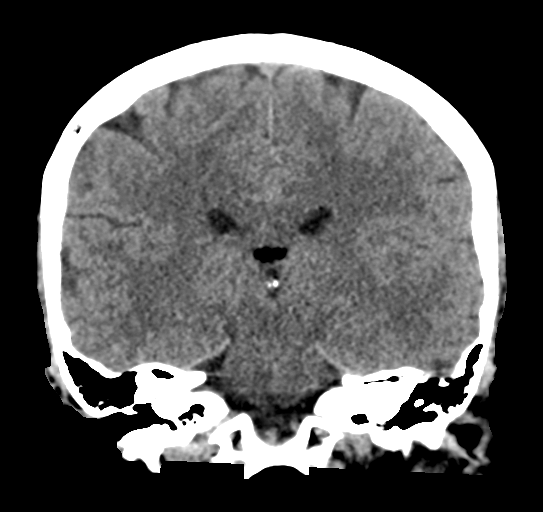
[im 37/67  brain]
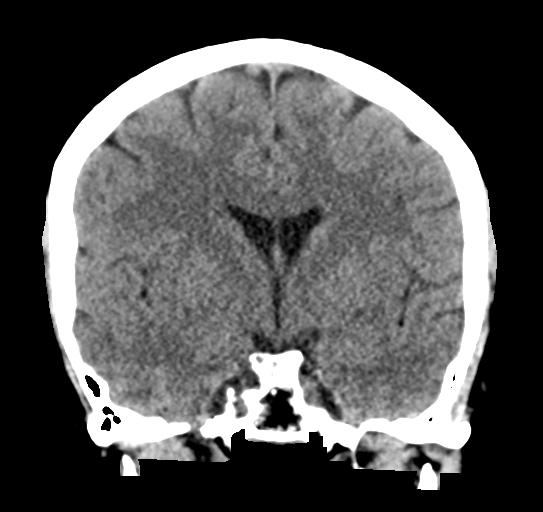

[Series 6: head 3.0 mpr sag · sagittal · 0.31mm/px · 3 of 58 slices shown]
[im 20/58  brain]
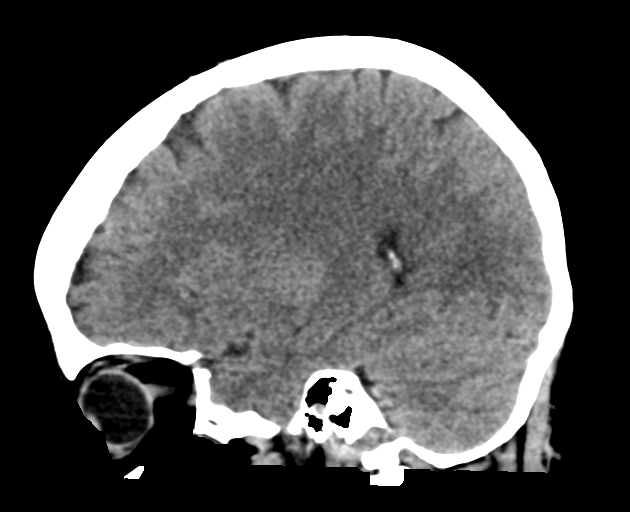
[im 29/58  brain]
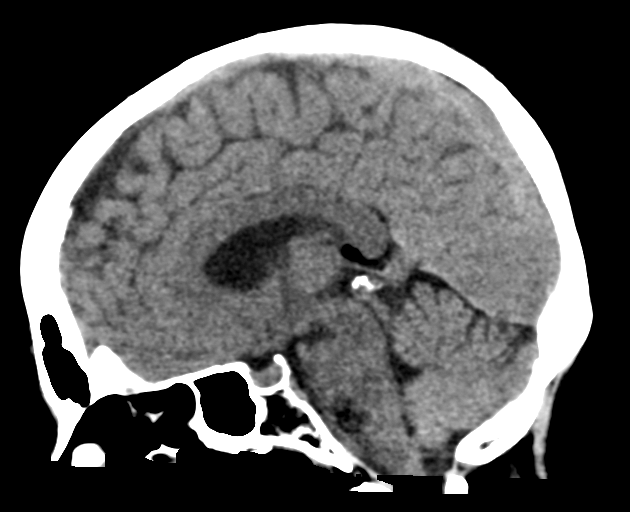
[im 39/58  brain]
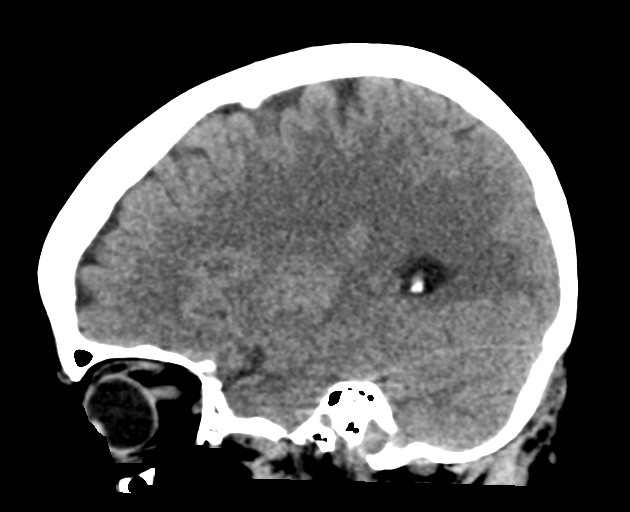

[16 of 47 positions shown; findings below may reference images not displayed]

FINDINGS: Brain: No evidence of acute infarction, hemorrhage, hydrocephalus,
extra-axial collection or mass lesion/mass effect.

Vascular: No hyperdense vessel or unexpected calcification.

Skull: Normal. Negative for fracture or focal lesion.

Sinuses/Orbits: No acute finding.

Other: None.
IMPRESSION: No acute intracranial abnormality noted.

## 2021-11-09 IMAGING — DX DG KNEE 1-2V PORT*R*
2 series · 2 of 2 positions shown · non-contrast
Comparison: 08/13/2021

CLINICAL DATA: Postop for fraction repair

EXAM:
PORTABLE RIGHT KNEE - 1-2 VIEW

[knee ap]
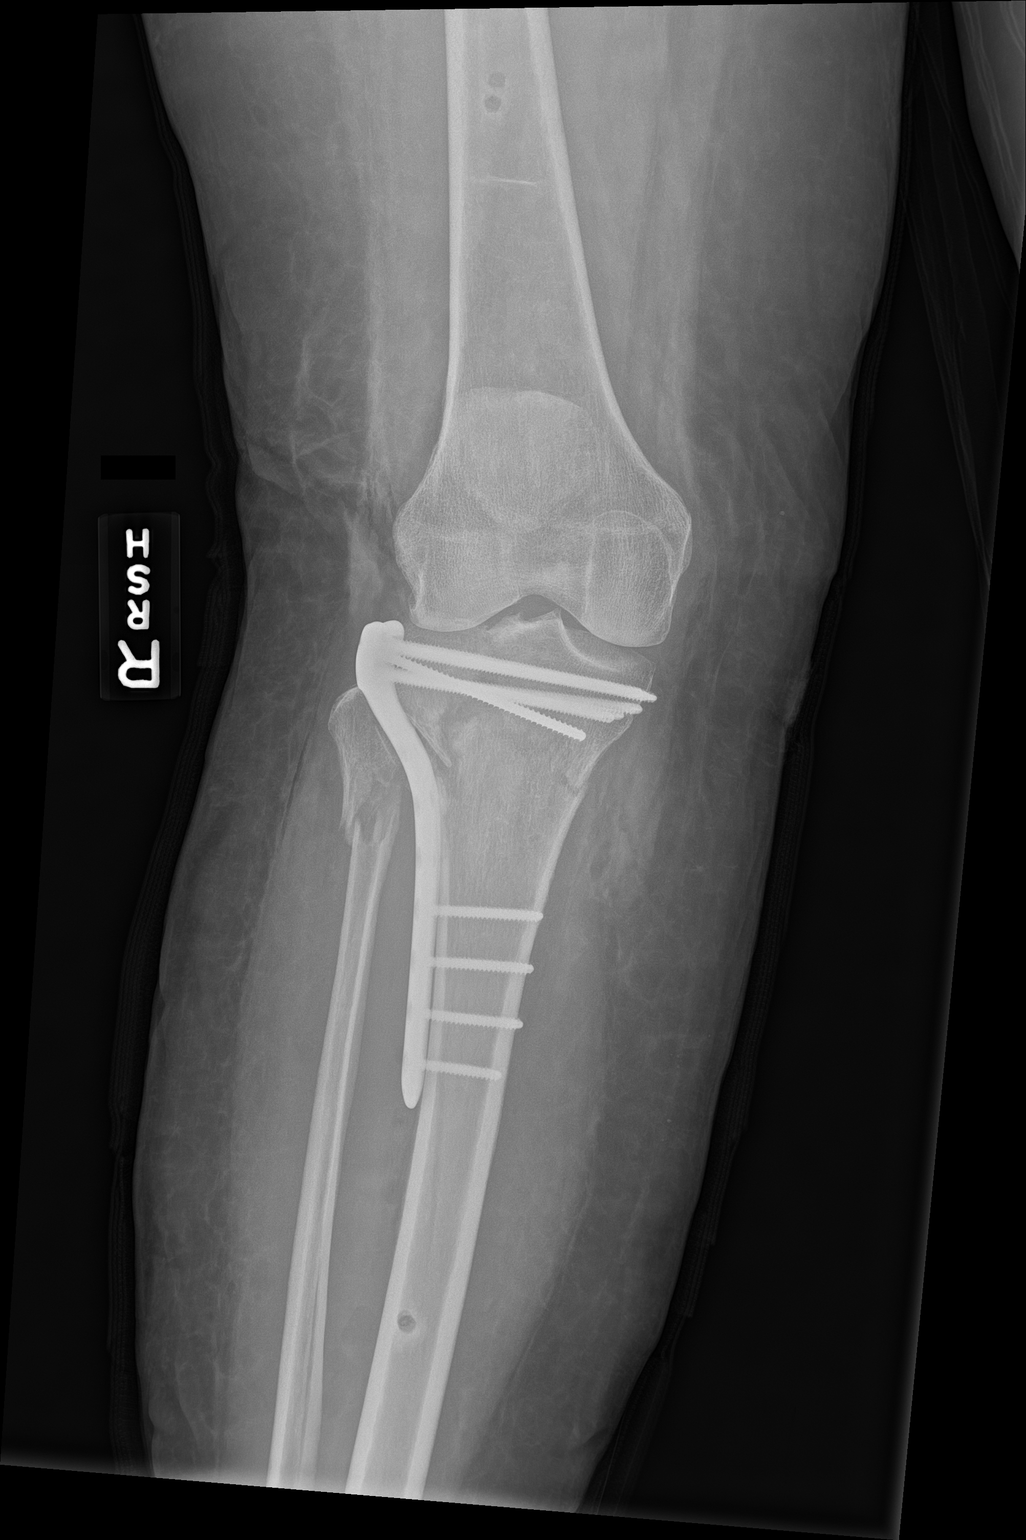

[knee lat]
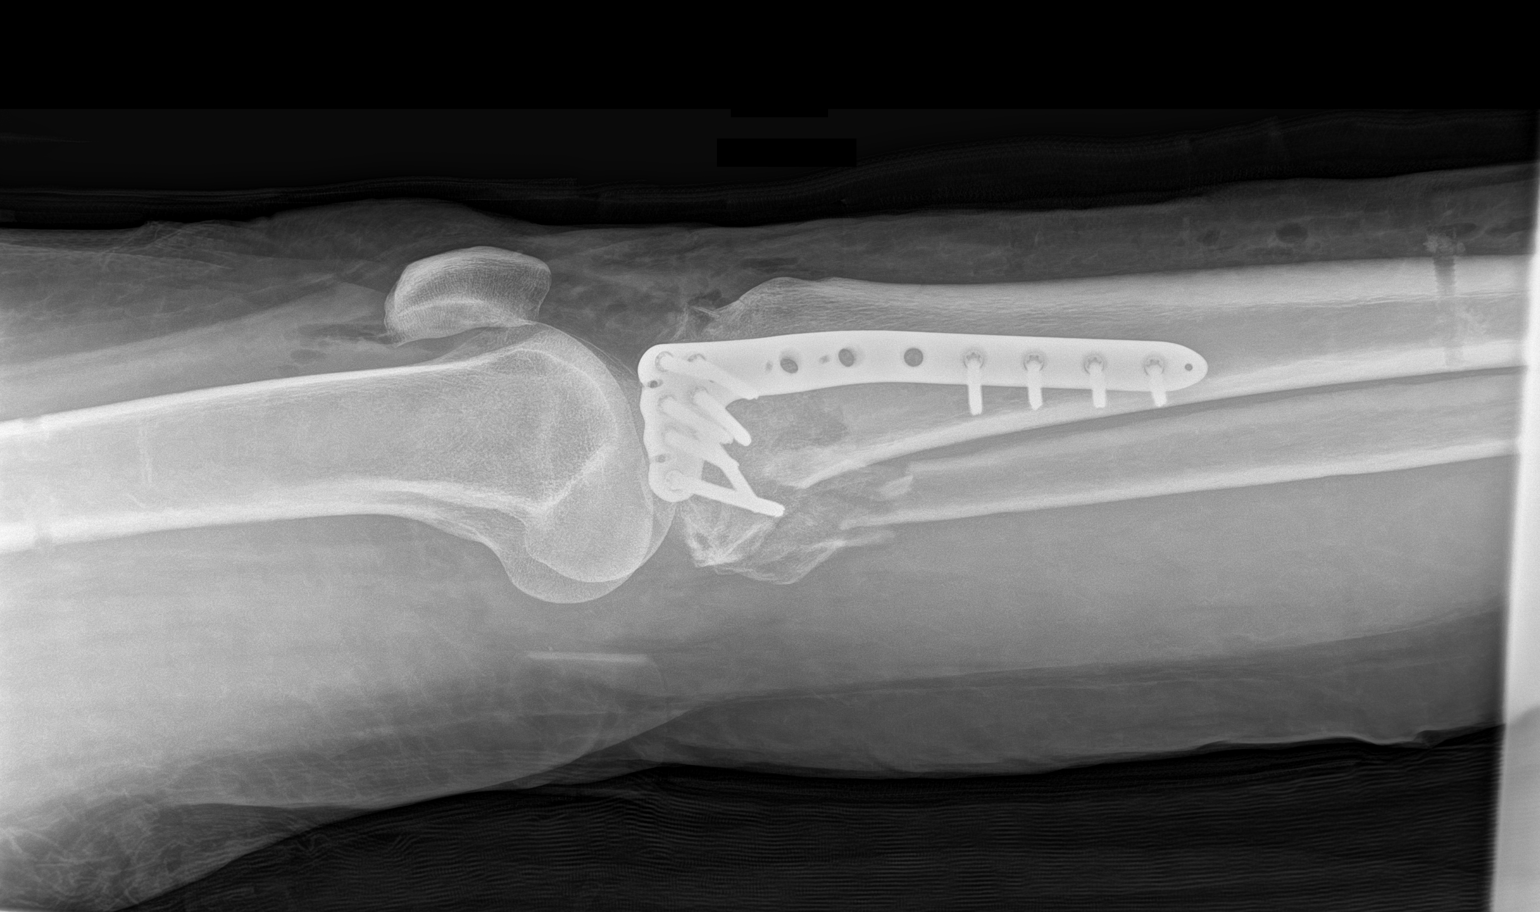

[2 of 2 positions shown; findings below may reference images not displayed]

FINDINGS: Interval internal fixation of previously described comminuted tibial
plateau fracture. Alignment is improved, nearly anatomic. Proximal
fibular fracture again identified. Defect from external fixator
device in the femoral and tibial shaft.
IMPRESSION: Internal fixation of the proximal tibia, as above.

## 2021-11-09 IMAGING — RF DG KNEE COMPLETE 4+V*R*
1 series · 4 of 4 positions shown · non-contrast
Comparison: Preoperative imaging.

CLINICAL DATA: Elective surgery.

EXAM:
RIGHT KNEE - COMPLETE 4+ VIEW

[Series 1: run · 4 of 4 slices shown]
[im 1/4]
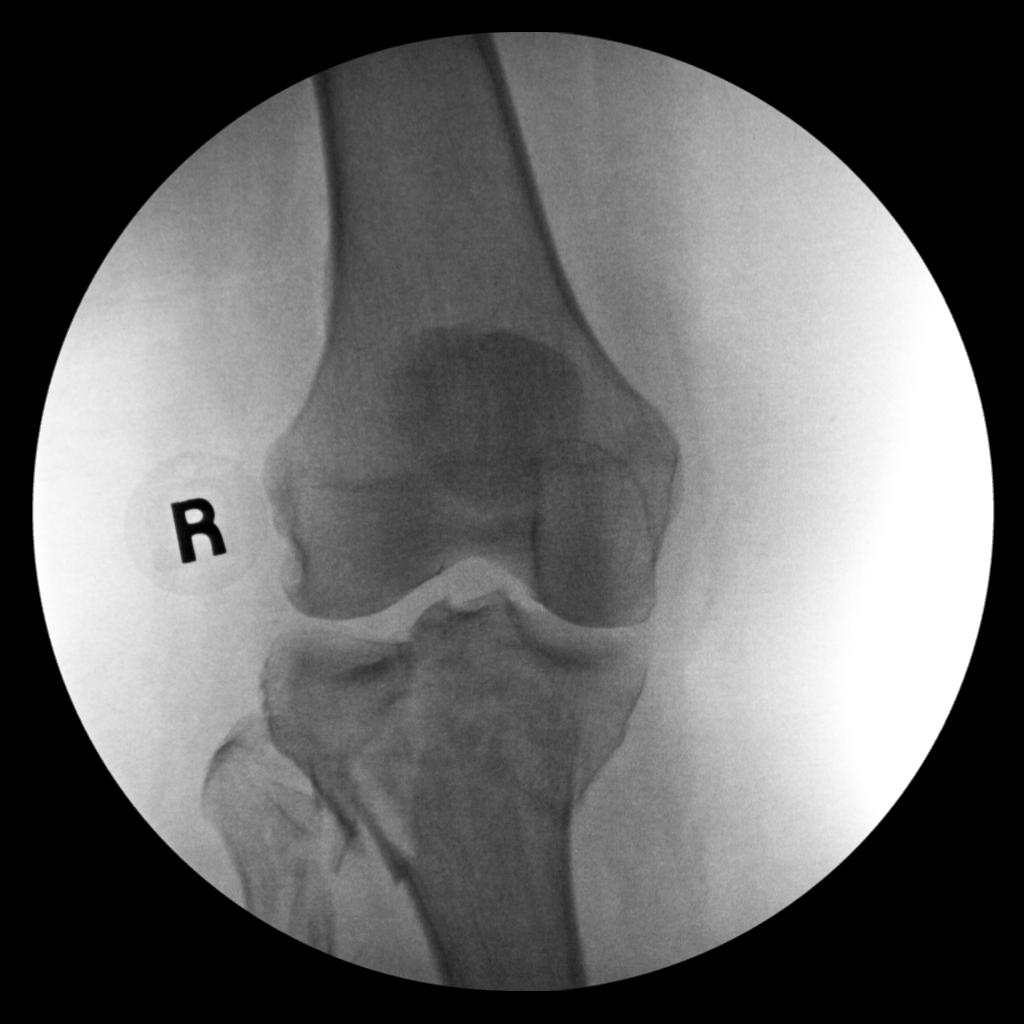
[im 2/4]
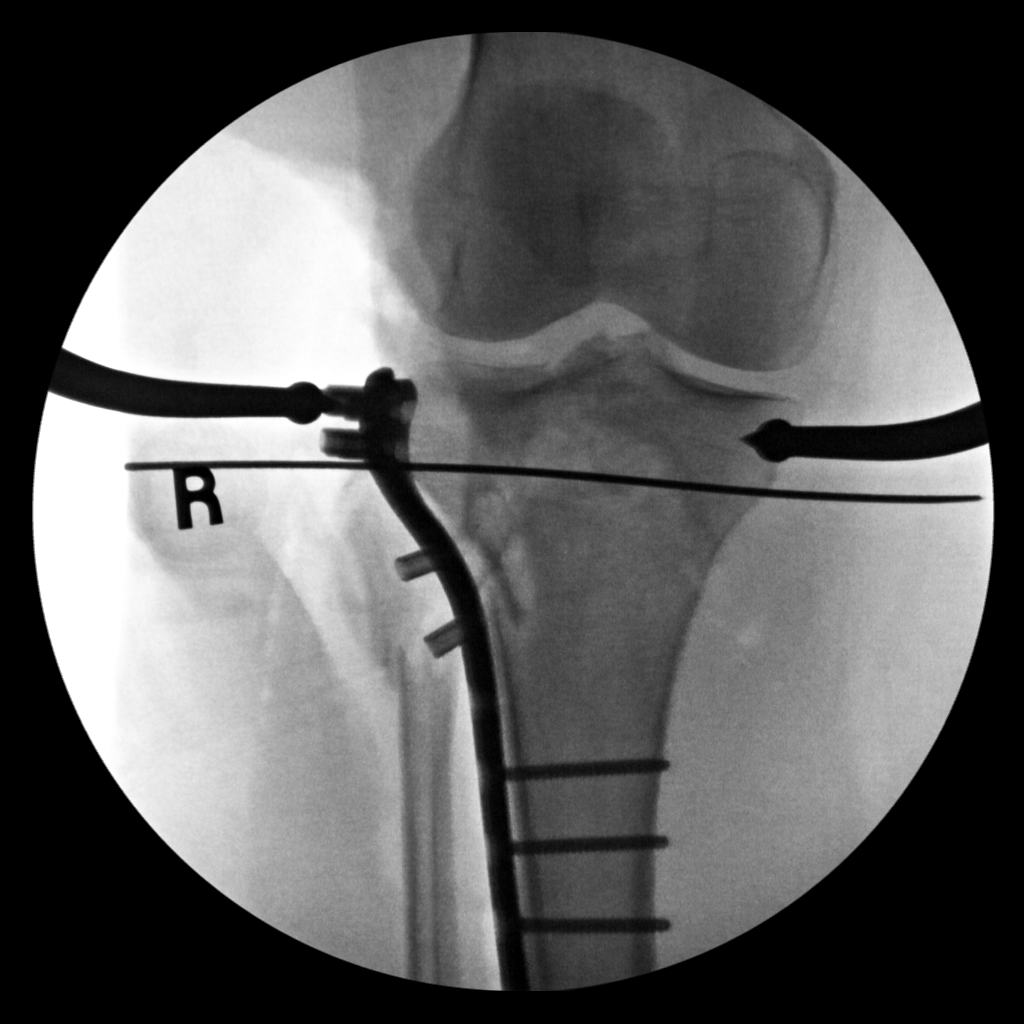
[im 3/4]
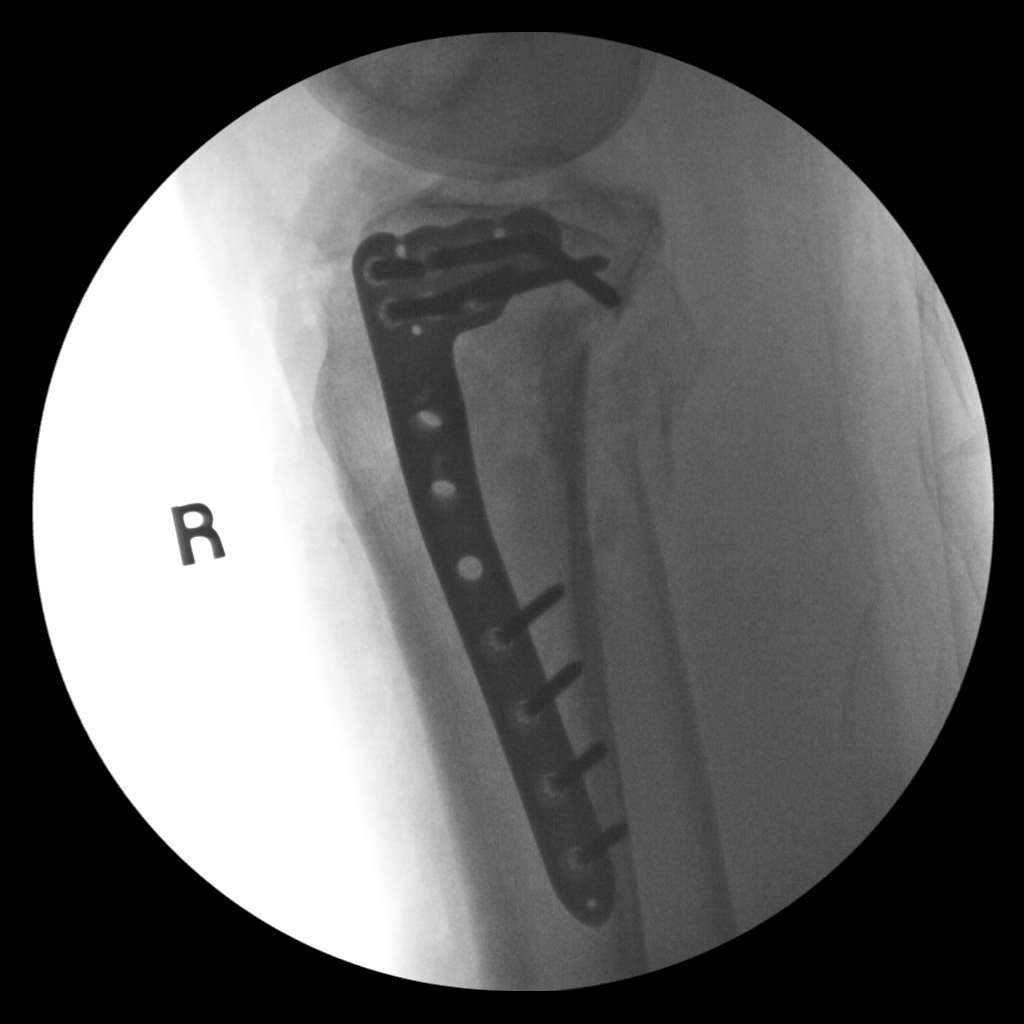
[im 4/4]
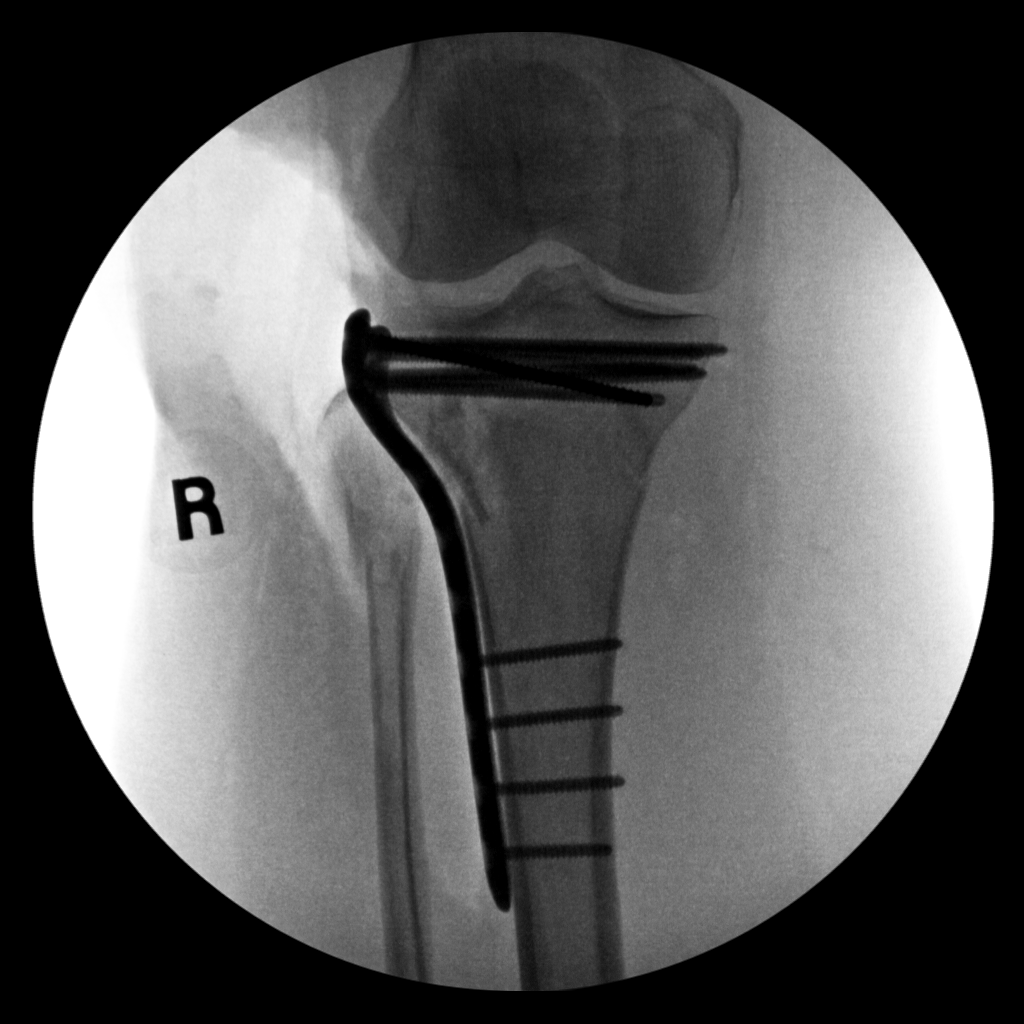

[4 of 4 positions shown; findings below may reference images not displayed]

FINDINGS: Four fluoroscopic spot views of the right knee obtained in the
operating room. Lateral plate and multi screw fixation of proximal
tibial fracture. Fluoroscopy time 48 seconds. Dose 2.36 mGy.
IMPRESSION: Intraoperative fluoroscopy during proximal tibial fracture ORIF.

## 2023-01-02 ENCOUNTER — Other Ambulatory Visit: Payer: Self-pay

## 2023-01-02 ENCOUNTER — Encounter (HOSPITAL_COMMUNITY): Payer: Self-pay | Admitting: Orthopedic Surgery

## 2023-01-02 NOTE — Progress Notes (Signed)
PCP - Denies  Cardiologist -  Denies  EP-  Denies  Endocrine-  Denies  Pulm-  Denies  Chest x-ray -  Denies  EKG - 01/03/23- Day of surgery  Stress Test -  Denies  ECHO -  Denies  Cardiac Cath -  Denies  AICD-na PM-na LOOP-na  Nerve Stimulator-  Denies  Dialysis-  Denies  Sleep Study -  Denies CPAP -  Denies  LABS- 01/03/23: CBC, BMP, PCR  ASA-  Denies  ERAS- No  HA1C-  Denies  Anesthesia- No  Pt denies having chest pain, sob, or fever during the pre-op phone call. All instructions explained to the pt, with a verbal understanding of the material. Pt also instructed to wear a mask and social distance if she goes out. The opportunity to ask questions was provided.

## 2023-01-02 NOTE — Anesthesia Preprocedure Evaluation (Signed)
Anesthesia Evaluation  Patient identified by MRN, date of birth, ID band Patient awake    Reviewed: Allergy & Precautions, NPO status , Patient's Chart, lab work & pertinent test results  Airway Mallampati: II  TM Distance: >3 FB Neck ROM: Full    Dental no notable dental hx.    Pulmonary neg pulmonary ROS   Pulmonary exam normal        Cardiovascular hypertension, Pt. on medications  Rhythm:Regular Rate:Normal     Neuro/Psych   Anxiety     negative neurological ROS     GI/Hepatic negative GI ROS, Neg liver ROS,,,  Endo/Other  negative endocrine ROS    Renal/GU negative Renal ROS  negative genitourinary   Musculoskeletal  (+) Arthritis , Osteoarthritis,  Symptomatic hardware    Abdominal Normal abdominal exam  (+)   Peds  Hematology negative hematology ROS (+)   Anesthesia Other Findings   Reproductive/Obstetrics                             Anesthesia Physical Anesthesia Plan  ASA: 2  Anesthesia Plan: General   Post-op Pain Management:    Induction: Intravenous  PONV Risk Score and Plan: 3 and Ondansetron, Dexamethasone, Midazolam and Treatment may vary due to age or medical condition  Airway Management Planned: Mask and Oral ETT  Additional Equipment: None  Intra-op Plan:   Post-operative Plan: Extubation in OR  Informed Consent: I have reviewed the patients History and Physical, chart, labs and discussed the procedure including the risks, benefits and alternatives for the proposed anesthesia with the patient or authorized representative who has indicated his/her understanding and acceptance.     Dental advisory given  Plan Discussed with: CRNA  Anesthesia Plan Comments: (Lab Results      Component                Value               Date                      WBC                      7.0                 01/03/2023                HGB                      12.9                 01/03/2023                HCT                      39.4                01/03/2023                MCV                      93.1                01/03/2023                PLT  247                 01/03/2023             Lab Results      Component                Value               Date                      NA                       138                 01/03/2023                K                        4.1                 01/03/2023                CO2                      27                  01/03/2023                GLUCOSE                  114 (H)             01/03/2023                BUN                      16                  01/03/2023                CREATININE               0.73                01/03/2023                CALCIUM                  9.0                 01/03/2023                GFRNONAA                 >60                 01/03/2023           )       Anesthesia Quick Evaluation

## 2023-01-02 NOTE — Progress Notes (Signed)
S.D.W- Instructions   Your procedure is scheduled on Thurs., Jan. 4, 2024 from 8:00AM-9:36AM.  Report to [facility] Main Entrance "A" at 5:30 A.M., then check in with the Admitting office.  Call this number if you have problems the morning of surgery:  [phone]             If you experience any cold or flu symptoms such as cough, fever, chills, shortness of breath, etc. between now and your scheduled surgery, please notify us at the above         number.  Masks are now required throughout our facilities due to the increasing cases of Covid, Flu, and RSV infections.   Remember:  Do not eat after midnight on Jan. 3rd    Take these medicines the morning of surgery with A SIP OF WATER:  If Needed: Tylenol Xanax  As of today, STOP taking any Aspirin (unless otherwise instructed by your surgeon) Aleve, Naproxen, Ibuprofen, Motrin, Advil, Goody's, BC's, all herbal medications, fish oil, and all vitamins.       Do not wear jewelry or makeup. Do not wear lotions, powders, perfume, or deodorant. Do not shave 48 hours prior to surgery.   Do not bring valuables to the hospital. Do not wear nail polish, gel polish, artificial nails, or any other type of covering on natural nails (fingers and toes) If you have artificial nails or gel coating that need to be removed by a nail salon, please have this removed prior to surgery. Artificial nails or gel coating may interfere with anesthesia's ability to adequately monitor your vital signs.  [facility] is not responsible for any belongings or valuables.    Do NOT Smoke (Tobacco/Vaping)  24 hours prior to your procedure  If you use a CPAP at night, you may bring your mask for your overnight stay.   Contacts, glasses, hearing aids, dentures or partials may not be worn into surgery, please bring cases for these belongings   For patients admitted to the hospital, discharge time will be determined by your treatment team.   Patients discharged the  day of surgery will not be allowed to drive home, and someone needs to stay with them for 24 hours.  Special instructions:    Oral Hygiene is also important to reduce your risk of infection.  Remember - BRUSH YOUR TEETH THE MORNING OF SURGERY WITH YOUR REGULAR TOOTHPASTE  [place]- Preparing For Surgery  Before surgery, you can play an important role. Because skin is not sterile, your skin needs to be as free of germs as possible. You can reduce the number of germs on your skin by washing with Antibacterial Soap before surgery.     Please follow these instructions carefully.     Shower the NIGHT BEFORE SURGERY and the MORNING OF SURGERY with Antibacterial Soap.   Pat yourself dry with a CLEAN TOWEL.  Wear CLEAN PAJAMAS to bed the night before surgery  Place CLEAN SHEETS on your bed the night before your surgery  DO NOT SLEEP WITH PETS.  Day of Surgery:  Take a shower with Antibacterial soap. Wear Clean/Comfortable clothing the morning of surgery Do not apply any deodorants/lotions.   Remember to brush your teeth WITH YOUR REGULAR TOOTHPASTE.   If you test positive for Covid, or been in contact with anyone that has tested positive in the last 10 days, please notify your surgeon.  SURGICAL WAITING ROOM VISITATION Patients having surgery or a procedure may have no more than  2 support people in the waiting area - these visitors may rotate.   Children under the age of 66 must have an adult with them who is not the patient. If the patient needs to stay at the hospital during part of their recovery, the visitor guidelines for inpatient rooms apply. Pre-op nurse will coordinate an appropriate time for 1 support person to accompany patient in pre-op.  This support person may not rotate.   Please refer to the [facility] website for the visitor guidelines for Inpatients (after your surgery is over and you are in a regular room).

## 2023-01-02 NOTE — H&P (Signed)
Orthopaedic Trauma Service (OTS) Consult   Patient ID: Faith Stevens MRN: [id number] DOB/AGE: [birth year] 62 y.o.    HPI: Faith Stevens is an 62 y.o. female who underwent ORIF of her right tibial plateau August 2022.  She has recovered well but unfortunately has developed increasing right knee pain.  She underwent steroid injection in the office several months ago which did provide some relief.  Treatment options were discussed with the patient decision was made for hardware removal in preparation for total knee arthroplasty.  Risks and benefits were reviewed with the patient she wished to proceed  Past Medical History:  Diagnosis Date   Anxiety 08/14/2021   Essential hypertension 08/14/2021   HTN (hypertension) with goal to be determined     Past Surgical History:  Procedure Laterality Date   ABDOMINAL HYSTERECTOMY     CHOLECYSTECTOMY     EXTERNAL FIXATION LEG Right 08/13/2021   Procedure: EXTERNAL FIXATION LEG;  Surgeon: Altamese Mattoon, MD;  Location: [place];  Service: Orthopedics;  Laterality: Right;   EXTERNAL FIXATION REMOVAL Right 08/24/2021   Procedure: REMOVAL EXTERNAL FIXATION LEG;  Surgeon: Altamese Herminie, MD;  Location: [place];  Service: Orthopedics;  Laterality: Right;   ORIF TIBIA PLATEAU Right 08/24/2021   Procedure: OPEN REDUCTION INTERNAL FIXATION (ORIF) TIBIAL PLATEAU;  Surgeon: Altamese Lake Viking, MD;  Location: [place];  Service: Orthopedics;  Laterality: Right;    No family history on file.  Social History:  reports that she has never smoked. She has never used smokeless tobacco. She reports that she does not drink alcohol and does not use drugs.  Allergies:  Allergies  Allergen Reactions   Latex Swelling    Medications:  Current Outpatient Medications  Medication Instructions   acetaminophen (TYLENOL) 1,000 mg, Oral, Every 8 hours PRN   ALPRAZolam (XANAX) 0.5 mg, Oral, Daily PRN   amLODipine (NORVASC) 2.5 mg, Oral, Every evening   B Complex-C  (B-COMPLEX WITH VITAMIN C) tablet 1 tablet, Oral, Daily   Cholecalciferol (VITAMIN D3 PO) 1 tablet, Oral, Daily   diclofenac Sodium (VOLTAREN) 1 % GEL 1 Application, Topical, 2 times daily PRN   olmesartan (BENICAR) 20 mg, Oral, Daily     No results found for this or any previous visit (from the past 48 hour(s)).  No results found.  Intake/Output    None      Review of Systems  Constitutional:  Negative for chills and fever.  Respiratory:  Negative for shortness of breath and wheezing.   Cardiovascular:  Negative for chest pain and palpitations.  Gastrointestinal:  Negative for nausea and vomiting.  Musculoskeletal:  Positive for joint pain (Right knee pain).  Neurological:  Negative for tingling and sensory change.   There were no vitals taken for this visit. Physical Exam Constitutional:      Appearance: Normal appearance.  HENT:     Head: Normocephalic and atraumatic.  Eyes:     Extraocular Movements: Extraocular movements intact.  Cardiovascular:     Rate and Rhythm: Normal rate and regular rhythm.  Pulmonary:     Effort: Pulmonary effort is normal.  Musculoskeletal:     Comments: Right lower extremity Right knee surgical wounds well-healed Good knee range of motion Distal motor and sensory function intact Extremity is warm No pitting edema Knee is grossly stable with evaluation + DP pulse No DCT  Skin:    General: Skin is warm.     Capillary Refill: Capillary refill takes less than 2 seconds.  Neurological:     Mental Status: She is alert and oriented to person, place, and time.  Psychiatric:        Mood and Affect: Mood normal.        Behavior: Behavior normal.        Thought Content: Thought content normal.     Assessment/Plan:  62 year old female s/p ORIF right tibial plateau with symptomatic posttraumatic arthritis right knee  -Posttraumatic arthritis right knee s/p ORIF right tibial plateau  OR for removal of hardware  Outpatient  procedure  Weight-bear as tolerated postoperatively.  No range of motion restrictions.  Risks and benefits reviewed the patient she wished to proceed  - Dispo:  OR for removal of hardware right proximal tibia  Discharged home from PACU once stable   Jari Pigg, PA-C [phone] (C) 01/02/2023, 5:35 PM  [facility] [place] [id number] [phone] Jenetta [phone] (F)    After 5pm and on the weekends please log on to Am[employer], go to orthopaedics and the look under the [facility] Call for the provider(s) on call. You can also call our office at [phone] and then follow the prompts to be connected to the call team.

## 2023-01-03 ENCOUNTER — Other Ambulatory Visit (HOSPITAL_COMMUNITY): Payer: Self-pay

## 2023-01-03 ENCOUNTER — Encounter (HOSPITAL_COMMUNITY)
Admission: RE | Disposition: A | Discharge status: Home / Self Care | Payer: Self-pay | Source: Home / Self Care | Attending: Orthopedic Surgery

## 2023-01-03 ENCOUNTER — Ambulatory Visit (HOSPITAL_COMMUNITY)
Admission: RE | Admit: 2023-01-03 | Discharge: 2023-01-03 | Disposition: A | Discharge status: Home / Self Care | Payer: BC Managed Care – PPO | Attending: Orthopedic Surgery | Admitting: Orthopedic Surgery

## 2023-01-03 ENCOUNTER — Ambulatory Visit (HOSPITAL_COMMUNITY): Payer: BC Managed Care – PPO | Admitting: Anesthesiology

## 2023-01-03 ENCOUNTER — Ambulatory Visit (HOSPITAL_COMMUNITY): Payer: BC Managed Care – PPO

## 2023-01-03 ENCOUNTER — Other Ambulatory Visit: Payer: Self-pay

## 2023-01-03 ENCOUNTER — Encounter (HOSPITAL_COMMUNITY): Payer: Self-pay | Admitting: Orthopedic Surgery

## 2023-01-03 DIAGNOSIS — M25561 Pain in right knee: Secondary | ICD-10-CM | POA: Diagnosis not present

## 2023-01-03 DIAGNOSIS — F419 Anxiety disorder, unspecified: Secondary | ICD-10-CM | POA: Insufficient documentation

## 2023-01-03 DIAGNOSIS — Y793 Surgical instruments, materials and orthopedic devices (including sutures) associated with adverse incidents: Secondary | ICD-10-CM | POA: Diagnosis not present

## 2023-01-03 DIAGNOSIS — Z9889 Other specified postprocedural states: Secondary | ICD-10-CM | POA: Diagnosis not present

## 2023-01-03 DIAGNOSIS — I10 Essential (primary) hypertension: Secondary | ICD-10-CM | POA: Insufficient documentation

## 2023-01-03 DIAGNOSIS — T8484XA Pain due to internal orthopedic prosthetic devices, implants and grafts, initial encounter: Secondary | ICD-10-CM | POA: Insufficient documentation

## 2023-01-03 HISTORY — PX: HARDWARE REMOVAL: SHX979

## 2023-01-03 HISTORY — DX: Unspecified osteoarthritis, unspecified site: M19.90

## 2023-01-03 LAB — CBC
HCT: 39.4 % (ref 36.0–46.0)
Hemoglobin: 12.9 g/dL (ref 12.0–15.0)
MCH: 30.5 pg (ref 26.0–34.0)
MCHC: 32.7 g/dL (ref 30.0–36.0)
MCV: 93.1 fL (ref 80.0–100.0)
Platelets: 247 10*3/uL (ref 150–400)
RBC: 4.23 MIL/uL (ref 3.87–5.11)
RDW: 12.1 % (ref 11.5–15.5)
WBC: 7 10*3/uL (ref 4.0–10.5)
nRBC: 0 % (ref 0.0–0.2)

## 2023-01-03 LAB — BASIC METABOLIC PANEL
Anion gap: 10 (ref 5–15)
BUN: 16 mg/dL (ref 6–20)
CO2: 27 mmol/L (ref 22–32)
Calcium: 9 mg/dL (ref 8.9–10.3)
Chloride: 101 mmol/L (ref 98–111)
Creatinine, Ser: 0.73 mg/dL (ref 0.44–1.00)
GFR, Estimated: >60 mL/min (ref >60)
Glucose, Bld: 114 mg/dL — ABNORMAL HIGH (ref 70–99)
Potassium: 4.1 mmol/L (ref 3.5–5.1)
Sodium: 138 mmol/L (ref 135–145)

## 2023-01-03 SURGERY — REMOVAL, HARDWARE
Anesthesia: General | Site: Knee | Laterality: Right

## 2023-01-03 MED ORDER — LIDOCAINE 2% (20 MG/ML) 5 ML SYRINGE
INTRAMUSCULAR | Status: AC
  Filled 2023-01-03: qty 5

## 2023-01-03 MED ORDER — LACTATED RINGERS IV SOLN: INTRAVENOUS | Status: DC

## 2023-01-03 MED ORDER — BUPIVACAINE LIPOSOME 1.3 % IJ SUSP
INTRAMUSCULAR | Status: AC
  Filled 2023-01-03: qty 20

## 2023-01-03 MED ORDER — HYDROGEN PEROXIDE 3 % EX SOLN
CUTANEOUS | Status: DC | PRN
  Administered 2023-01-03: 1

## 2023-01-03 MED ORDER — FENTANYL CITRATE (PF) 100 MCG/2ML IJ SOLN: 25.0000 ug | INTRAMUSCULAR | Status: DC | PRN

## 2023-01-03 MED ORDER — MIDAZOLAM HCL 2 MG/2ML IJ SOLN
INTRAMUSCULAR | Status: AC
  Filled 2023-01-03: qty 2

## 2023-01-03 MED ORDER — KETOROLAC TROMETHAMINE 15 MG/ML IJ SOLN
INTRAMUSCULAR | Status: DC | PRN
  Administered 2023-01-03: 30 mg via INTRAVENOUS

## 2023-01-03 MED ORDER — OXYCODONE-ACETAMINOPHEN 5-325 MG PO TABS
1.0000 | ORAL_TABLET | ORAL | 0 refills | Status: DC | PRN
  Filled 2023-01-03: qty 20, 4d supply, fill #0

## 2023-01-03 MED ORDER — KETOROLAC TROMETHAMINE 10 MG PO TABS
10.0000 mg | ORAL_TABLET | Freq: Four times a day (QID) | ORAL | 0 refills | Status: DC | PRN
  Filled 2023-01-03: qty 20, 5d supply, fill #0

## 2023-01-03 MED ORDER — MIDAZOLAM HCL 2 MG/2ML IJ SOLN
INTRAMUSCULAR | Status: DC | PRN
  Administered 2023-01-03: 2 mg via INTRAVENOUS

## 2023-01-03 MED ORDER — BUPIVACAINE LIPOSOME 1.3 % IJ SUSP
INTRAMUSCULAR | Status: DC | PRN
  Administered 2023-01-03: 20 mL

## 2023-01-03 MED ORDER — ROCURONIUM BROMIDE 10 MG/ML (PF) SYRINGE
PREFILLED_SYRINGE | INTRAVENOUS | Status: DC | PRN
  Administered 2023-01-03: 60 mg via INTRAVENOUS

## 2023-01-03 MED ORDER — PROPOFOL 10 MG/ML IV BOLUS
INTRAVENOUS | Status: AC
  Filled 2023-01-03: qty 20

## 2023-01-03 MED ORDER — ONDANSETRON 4 MG PO TBDP
4.0000 mg | ORAL_TABLET | Freq: Three times a day (TID) | ORAL | 0 refills | Status: DC | PRN
  Filled 2023-01-03: qty 20, 7d supply, fill #0

## 2023-01-03 MED ORDER — DEXAMETHASONE SODIUM PHOSPHATE 10 MG/ML IJ SOLN
INTRAMUSCULAR | Status: AC
  Filled 2023-01-03: qty 1

## 2023-01-03 MED ORDER — PHENYLEPHRINE 80 MCG/ML (10ML) SYRINGE FOR IV PUSH (FOR BLOOD PRESSURE SUPPORT)
PREFILLED_SYRINGE | INTRAVENOUS | Status: AC
  Filled 2023-01-03: qty 10

## 2023-01-03 MED ORDER — CHLORHEXIDINE GLUCONATE 0.12 % MT SOLN
15.0000 mL | Freq: Once | OROMUCOSAL | Status: AC
  Administered 2023-01-03: 15 mL via OROMUCOSAL
  Filled 2023-01-03: qty 15

## 2023-01-03 MED ORDER — FENTANYL CITRATE (PF) 250 MCG/5ML IJ SOLN
INTRAMUSCULAR | Status: DC | PRN
  Administered 2023-01-03: 100 ug via INTRAVENOUS

## 2023-01-03 MED ORDER — FENTANYL CITRATE (PF) 250 MCG/5ML IJ SOLN
INTRAMUSCULAR | Status: AC
  Filled 2023-01-03: qty 5

## 2023-01-03 MED ORDER — ONDANSETRON HCL 4 MG/2ML IJ SOLN
INTRAMUSCULAR | Status: DC | PRN
  Administered 2023-01-03: 4 mg via INTRAVENOUS

## 2023-01-03 MED ORDER — ACETAMINOPHEN 500 MG PO TABS: 500.0000 mg | ORAL_TABLET | Freq: Three times a day (TID) | ORAL | 0 refills | Status: DC | PRN

## 2023-01-03 MED ORDER — ORAL CARE MOUTH RINSE: 15.0000 mL | Freq: Once | OROMUCOSAL | Status: AC

## 2023-01-03 MED ORDER — ROCURONIUM BROMIDE 10 MG/ML (PF) SYRINGE
PREFILLED_SYRINGE | INTRAVENOUS | Status: AC
  Filled 2023-01-03: qty 10

## 2023-01-03 MED ORDER — SUGAMMADEX SODIUM 200 MG/2ML IV SOLN
INTRAVENOUS | Status: DC | PRN
  Administered 2023-01-03: 200 mg via INTRAVENOUS

## 2023-01-03 MED ORDER — CEFAZOLIN SODIUM-DEXTROSE 2-4 GM/100ML-% IV SOLN
2.0000 g | INTRAVENOUS | Status: AC
  Administered 2023-01-03: 2 g via INTRAVENOUS
  Filled 2023-01-03: qty 100

## 2023-01-03 MED ORDER — ONDANSETRON HCL 4 MG/2ML IJ SOLN
INTRAMUSCULAR | Status: AC
  Filled 2023-01-03: qty 2

## 2023-01-03 MED ORDER — ACETAMINOPHEN 10 MG/ML IV SOLN: 1000.0000 mg | Freq: Once | INTRAVENOUS | Status: DC | PRN

## 2023-01-03 MED ORDER — PROPOFOL 10 MG/ML IV BOLUS
INTRAVENOUS | Status: DC | PRN
  Administered 2023-01-03: 150 mg via INTRAVENOUS

## 2023-01-03 MED ORDER — METHOCARBAMOL 500 MG PO TABS
500.0000 mg | ORAL_TABLET | Freq: Three times a day (TID) | ORAL | 0 refills | Status: DC | PRN
  Filled 2023-01-03: qty 20, 7d supply, fill #0

## 2023-01-03 MED ORDER — LIDOCAINE 2% (20 MG/ML) 5 ML SYRINGE
INTRAMUSCULAR | Status: DC | PRN
  Administered 2023-01-03: 80 mg via INTRAVENOUS

## 2023-01-03 MED ORDER — HYDROCODONE-ACETAMINOPHEN 5-325 MG PO TABS
1.0000 | ORAL_TABLET | Freq: Three times a day (TID) | ORAL | 0 refills | Status: DC | PRN
  Filled 2023-01-03: qty 30, 5d supply, fill #0

## 2023-01-03 MED ORDER — 0.9 % SODIUM CHLORIDE (POUR BTL) OPTIME
TOPICAL | Status: DC | PRN
  Administered 2023-01-03: 1000 mL

## 2023-01-03 MED ORDER — DEXAMETHASONE SODIUM PHOSPHATE 10 MG/ML IJ SOLN
INTRAMUSCULAR | Status: DC | PRN
  Administered 2023-01-03: 5 mg via INTRAVENOUS

## 2023-01-03 SURGICAL SUPPLY — 61 items
BAG COUNTER SPONGE SURGICOUNT (BAG) ×1 IMPLANT
BANDAGE ESMARK 6X9 LF (GAUZE/BANDAGES/DRESSINGS) ×1 IMPLANT
BNDG COHESIVE 6X5 TAN STRL LF (GAUZE/BANDAGES/DRESSINGS) ×1 IMPLANT
BNDG ELASTIC 4X5.8 VLCR STR LF (GAUZE/BANDAGES/DRESSINGS) ×1 IMPLANT
BNDG ELASTIC 6X5.8 VLCR STR LF (GAUZE/BANDAGES/DRESSINGS) ×1 IMPLANT
BNDG ESMARK 6X9 LF (GAUZE/BANDAGES/DRESSINGS)
BNDG GAUZE DERMACEA FLUFF 4 (GAUZE/BANDAGES/DRESSINGS) ×2 IMPLANT
BRUSH SCRUB EZ PLAIN DRY (MISCELLANEOUS) ×2 IMPLANT
COVER SURGICAL LIGHT HANDLE (MISCELLANEOUS) ×2 IMPLANT
CUFF TOURN SGL QUICK 18X4 (TOURNIQUET CUFF) IMPLANT
CUFF TOURN SGL QUICK 24 (TOURNIQUET CUFF)
CUFF TOURN SGL QUICK 34 (TOURNIQUET CUFF)
CUFF TRNQT CYL 24X4X16.5-23 (TOURNIQUET CUFF) IMPLANT
CUFF TRNQT CYL 34X4.125X (TOURNIQUET CUFF) IMPLANT
DRAPE C-ARM 42X72 X-RAY (DRAPES) IMPLANT
DRAPE C-ARMOR (DRAPES) ×1 IMPLANT
DRAPE U-SHAPE 47X51 STRL (DRAPES) ×1 IMPLANT
DRSG ADAPTIC 3X8 NADH LF (GAUZE/BANDAGES/DRESSINGS) ×1 IMPLANT
ELECT REM PT RETURN 9FT ADLT (ELECTROSURGICAL) ×1
ELECTRODE REM PT RTRN 9FT ADLT (ELECTROSURGICAL) ×1 IMPLANT
GAUZE PAD ABD 8X10 STRL (GAUZE/BANDAGES/DRESSINGS) IMPLANT
GAUZE SPONGE 4X4 12PLY STRL (GAUZE/BANDAGES/DRESSINGS) ×1 IMPLANT
GLOVE BIO SURGEON STRL SZ7.5 (GLOVE) ×1 IMPLANT
GLOVE BIO SURGEON STRL SZ8 (GLOVE) ×1 IMPLANT
GLOVE BIOGEL PI IND STRL 7.5 (GLOVE) ×1 IMPLANT
GLOVE BIOGEL PI IND STRL 8 (GLOVE) ×1 IMPLANT
GLOVE SURG ORTHO LTX SZ7.5 (GLOVE) ×2 IMPLANT
GOWN STRL REUS W/ TWL LRG LVL3 (GOWN DISPOSABLE) ×2 IMPLANT
GOWN STRL REUS W/ TWL XL LVL3 (GOWN DISPOSABLE) ×1 IMPLANT
GOWN STRL REUS W/TWL LRG LVL3 (GOWN DISPOSABLE) ×2
GOWN STRL REUS W/TWL XL LVL3 (GOWN DISPOSABLE) ×1
KIT BASIN OR (CUSTOM PROCEDURE TRAY) ×1 IMPLANT
KIT TURNOVER KIT B (KITS) ×1 IMPLANT
MANIFOLD NEPTUNE II (INSTRUMENTS) ×1 IMPLANT
MARKER SKIN DUAL TIP RULER LAB (MISCELLANEOUS) IMPLANT
NDL 22X1.5 STRL (OR ONLY) (MISCELLANEOUS) IMPLANT
NEEDLE 22X1.5 STRL (OR ONLY) (MISCELLANEOUS) IMPLANT
NEEDLE HYPO 22GX1.5 SAFETY (NEEDLE) IMPLANT
NS IRRIG 1000ML POUR BTL (IV SOLUTION) ×1 IMPLANT
PACK ORTHO EXTREMITY (CUSTOM PROCEDURE TRAY) ×1 IMPLANT
PAD ARMBOARD 7.5X6 YLW CONV (MISCELLANEOUS) ×2 IMPLANT
PADDING CAST COTTON 6X4 STRL (CAST SUPPLIES) ×3 IMPLANT
SPONGE T-LAP 18X18 ~~LOC~~+RFID (SPONGE) ×1 IMPLANT
STAPLER VISISTAT 35W (STAPLE) IMPLANT
STOCKINETTE IMPERVIOUS LG (DRAPES) ×1 IMPLANT
STRIP CLOSURE SKIN 1/2X4 (GAUZE/BANDAGES/DRESSINGS) IMPLANT
SUCTION FRAZIER HANDLE 10FR (MISCELLANEOUS)
SUCTION TUBE FRAZIER 10FR DISP (MISCELLANEOUS) IMPLANT
SUT ETHILON 2 0 FS 18 (SUTURE) IMPLANT
SUT PDS AB 2-0 CT1 27 (SUTURE) IMPLANT
SUT VIC AB 0 CT1 27 (SUTURE) ×1
SUT VIC AB 0 CT1 27XBRD ANBCTR (SUTURE) IMPLANT
SUT VIC AB 2-0 CT1 27 (SUTURE) ×1
SUT VIC AB 2-0 CT1 TAPERPNT 27 (SUTURE) IMPLANT
SYR CONTROL 10ML LL (SYRINGE) IMPLANT
TOWEL GREEN STERILE (TOWEL DISPOSABLE) ×2 IMPLANT
TOWEL GREEN STERILE FF (TOWEL DISPOSABLE) ×2 IMPLANT
TUBE CONNECTING 12X1/4 (SUCTIONS) ×1 IMPLANT
UNDERPAD 30X36 HEAVY ABSORB (UNDERPADS AND DIAPERS) ×1 IMPLANT
WATER STERILE IRR 1000ML POUR (IV SOLUTION) ×2 IMPLANT
YANKAUER SUCT BULB TIP NO VENT (SUCTIONS) ×1 IMPLANT

## 2023-01-03 NOTE — Anesthesia Postprocedure Evaluation (Signed)
Anesthesia Post Note  Patient: Faith Stevens  Procedure(s) Performed: REMOVAL OF HARDWARE RIGHT KNEE (Right: Knee)     Patient location during evaluation: PACU Anesthesia Type: General Level of consciousness: awake and alert Pain management: pain level controlled Vital Signs Assessment: post-procedure vital signs reviewed and stable Respiratory status: spontaneous breathing, nonlabored ventilation, respiratory function stable and patient connected to nasal cannula oxygen Cardiovascular status: blood pressure returned to baseline and stable Postop Assessment: no apparent nausea or vomiting Anesthetic complications: no   No notable events documented.  Last Vitals:  Vitals:   01/03/23 1015 01/03/23 1025  BP: (!) 161/92 (!) 158/80  Pulse: 81 82  Resp: 14 13  Temp:  (!) 36.2 C  SpO2: 97% 99%    Last Pain:  Vitals:   01/03/23 1025  TempSrc:   PainSc: 0-No pain                 Belenda Cruise P Jaidan Prevette

## 2023-01-03 NOTE — Anesthesia Procedure Notes (Signed)
Procedure Name: Intubation Date/Time: 01/03/2023 8:41 AM  Performed by: Elvin So, CRNAPre-anesthesia Checklist: Patient identified, Emergency Drugs available, Suction available and Patient being monitored Patient Re-evaluated:Patient Re-evaluated prior to induction Oxygen Delivery Method: Circle System Utilized Preoxygenation: Pre-oxygenation with 100% oxygen Induction Type: IV induction Ventilation: Mask ventilation without difficulty Laryngoscope Size: Mac and 3 Grade View: Grade I Tube type: Oral Tube size: 7.0 mm Number of attempts: 1 Airway Equipment and Method: Stylet and Oral airway Placement Confirmation: ETT inserted through vocal cords under direct vision, positive ETCO2 and breath sounds checked- equal and bilateral Secured at: 21 cm Tube secured with: Tape Dental Injury: Teeth and Oropharynx as per pre-operative assessment

## 2023-01-03 NOTE — Discharge Instructions (Addendum)
[facility] Discharge Instructions   General Discharge Instructions   WEIGHT BEARING STATUS: weightbearing as tolerated. May need to use crutches or walker for a few days  RANGE OF MOTION/ACTIVITY: unrestricted motion of right knee. Slowly increase activity    Review the following resource for additional information regarding bone health  [email or web address]  Wound Care: daily wound care starting on 01/05/2023. See below  Discharge Wound Care Instructions  Do NOT apply any ointments, solutions or lotions to pin sites or surgical wounds.  These prevent needed drainage and even though solutions like hydrogen peroxide kill bacteria, they also damage cells lining the pin sites that help fight infection.  Applying lotions or ointments can keep the wounds moist and can cause them to breakdown and open up as well. This can increase the risk for infection. When in doubt call the office.  Surgical incisions should be dressed daily.  If any drainage is noted, use one layer of adaptic or Mepitel, then gauze, Kerlix, and an ace wrap.  [email or web address]  [email or web address].gl  These dressing supplies should be available at local medical supply stores ([facility], [facility], etc). They are not usually carried at places like [facility], [facility], [facility], etc  Once the incision is completely dry and without drainage, it may be left open to air out.  Showering may begin 36-48 hours later.  Cleaning gently with soap and water.  Traumatic wounds should be dressed daily as well.    One layer of adaptic, gauze, Kerlix, then ace wrap.  The adaptic can be discontinued once the draining has ceased    If you have a wet to dry  dressing: wet the gauze with saline the squeeze as much saline out so the gauze is moist (not soaking wet), place moistened gauze over wound, then place a dry gauze over the moist one, followed by Kerlix wrap, then ace wrap.  Diet: as you were eating previously.  Can use over the counter stool softeners and bowel preparations, such as Miralax, to help with bowel movements.  Narcotics can be constipating.  Be sure to drink plenty of fluids  PAIN MEDICATION USE AND EXPECTATIONS  You have likely been given narcotic medications to help control your pain.  After a traumatic event that results in an fracture (broken bone) with or without surgery, it is ok to use narcotic pain medications to help control one's pain.  We understand that everyone responds to pain differently and each individual patient will be evaluated on a regular basis for the continued need for narcotic medications. Ideally, narcotic medication use should last no more than 6-8 weeks (coinciding with fracture healing).   As a patient it is your responsibility as well to monitor narcotic medication use and report the amount and frequency you use these medications when you come to your office visit.   We would also advise that if you are using narcotic medications, you should take a dose prior to therapy to maximize you participation.  IF YOU ARE ON NARCOTIC MEDICATIONS IT IS NOT PERMISSIBLE TO OPERATE A MOTOR VEHICLE (MOTORCYCLE/CAR/TRUCK/MOPED) OR HEAVY MACHINERY DO NOT MIX NARCOTICS WITH OTHER CNS (CENTRAL NERVOUS SYSTEM) DEPRESSANTS SUCH AS ALCOHOL   POST-OPERATIVE OPIOID TAPER INSTRUCTIONS: It is important to wean off of your opioid medication as soon as possible. If you do not need pain medication after your surgery it is ok to stop day one. Opioids include: Codeine, Hydrocodone(Norco, Vicodin), Oxycodone(Percocet, oxycontin) and hydromorphone amongst others.  Long term and even short  term use of opiods can cause: Increased pain  response Dependence Constipation Depression Respiratory depression And more.  Withdrawal symptoms can include Flu like symptoms Nausea, vomiting And more Techniques to manage these symptoms Hydrate well Eat regular healthy meals Stay active Use relaxation techniques(deep breathing, meditating, yoga) Do Not substitute Alcohol to help with tapering If you have been on opioids for less than two weeks and do not have pain than it is ok to stop all together.  Plan to wean off of opioids This plan should start within one week post op of your fracture surgery  Maintain the same interval or time between taking each dose and first decrease the dose.  Cut the total daily intake of opioids by one tablet each day Next start to increase the time between doses. The last dose that should be eliminated is the evening dose.    STOP SMOKING OR USING NICOTINE PRODUCTS!!!!  As discussed nicotine severely impairs your body's ability to heal surgical and traumatic wounds but also impairs bone healing.  Wounds and bone heal by forming microscopic blood vessels (angiogenesis) and nicotine is a vasoconstrictor (essentially, shrinks blood vessels).  Therefore, if vasoconstriction occurs to these microscopic blood vessels they essentially disappear and are unable to deliver necessary nutrients to the healing tissue.  This is one modifiable factor that you can do to dramatically increase your chances of healing your injury.    (This means no smoking, no nicotine gum, patches, etc)  DO NOT USE NONSTEROIDAL ANTI-INFLAMMATORY DRUGS (NSAID'S)  Using products such as Advil (ibuprofen), Aleve (naproxen), Motrin (ibuprofen) for additional pain control during fracture healing can delay and/or prevent the healing response.  If you would like to take over the counter (OTC) medication, Tylenol (acetaminophen) is ok.  However, some narcotic medications that are given for pain control contain acetaminophen as well. Therefore,  you should not exceed more than 4000 mg of tylenol in a day if you do not have liver disease.  Also note that there are may OTC medicines, such as cold medicines and allergy medicines that my contain tylenol as well.  If you have any questions about medications and/or interactions please ask your doctor/PA or your pharmacist.      ICE AND ELEVATE INJURED/OPERATIVE EXTREMITY  Using ice and elevating the injured extremity above your heart can help with swelling and pain control.  Icing in a pulsatile fashion, such as 20 minutes on and 20 minutes off, can be followed.    Do not place ice directly on skin. Make sure there is a barrier between to skin and the ice pack.    Using frozen items such as frozen peas works well as the conform nicely to the are that needs to be iced.  USE AN ACE WRAP OR TED HOSE FOR SWELLING CONTROL  In addition to icing and elevation, Ace wraps or TED hose are used to help limit and resolve swelling.  It is recommended to use Ace wraps or TED hose until you are informed to stop.    When using Ace Wraps start the wrapping distally (farthest away from the body) and wrap proximally (closer to the body)   Example: If you had surgery on your leg or thing and you do not have a splint on, start the ace wrap at the toes and work your way up to the thigh        If you had surgery on your upper extremity and do not have a splint on, start the  ace wrap at your fingers and work your way up to the upper arm  IF YOU ARE IN A SPLINT OR CAST DO NOT REMOVE IT FOR ANY REASON   If your splint gets wet for any reason please contact the office immediately. You may shower in your splint or cast as long as you keep it dry.  This can be done by wrapping in a cast cover or garbage back (or similar)  Do Not stick any thing down your splint or cast such as pencils, money, or hangers to try and scratch yourself with.  If you feel itchy take benadryl as prescribed on the bottle for itching  IF YOU ARE IN  A CAM BOOT (BLACK BOOT)  You may remove boot periodically. Perform daily dressing changes as noted below.  Wash the liner of the boot regularly and wear a sock when wearing the boot. It is recommended that you sleep in the boot until told otherwise    Call office for the following: Temperature greater than 101F Persistent nausea and vomiting Severe uncontrolled pain Redness, tenderness, or signs of infection (pain, swelling, redness, odor or green/yellow discharge around the site) Difficulty breathing, headache or visual disturbances Hives Persistent dizziness or light-headedness Extreme fatigue Any other questions or concerns you may have after discharge  In an emergency, call 911 or go to an Emergency Department at a nearby hospital  HELPFUL INFORMATION  If you had a block, it will wear off between 8-24 hrs postop typically.  This is period when your pain may go from nearly zero to the pain you would have had postop without the block.  This is an abrupt transition but nothing dangerous is happening.  You may take an extra dose of narcotic when this happens.  You should wean off your narcotic medicines as soon as you are able.  Most patients will be off or using minimal narcotics before their first postop appointment.   We suggest you use the pain medication the first night prior to going to bed, in order to ease any pain when the anesthesia wears off. You should avoid taking pain medications on an empty stomach as it will make you nauseous.  Do not drink alcoholic beverages or take illicit drugs when taking pain medications.  In most states it is against the law to drive while you are in a splint or sling.  And certainly against the law to drive while taking narcotics.  You may return to work/school in the next couple of days when you feel up to it.   Pain medication may make you constipated.  Below are a few solutions to try in this order: Decrease the amount of pain medication if  you aren't having pain. Drink lots of decaffeinated fluids. Drink prune juice and/or each dried prunes  If the first 3 don't work start with additional solutions Take Colace - an over-the-counter stool softener Take Senokot - an over-the-counter laxative Take Miralax - a stronger over-the-counter laxative     CALL THE OFFICE WITH ANY QUESTIONS OR CONCERNS: [phone]   VISIT OUR WEBSITE FOR ADDITIONAL INFORMATION: [email or web address]

## 2023-01-03 NOTE — Transfer of Care (Signed)
**Note Faith-Identified via Obfuscation** Immediate Anesthesia Transfer of Care Note  Patient: Faith Stevens  Procedure(s) Performed: REMOVAL OF HARDWARE RIGHT KNEE (Right: Knee)  Patient Location: PACU  Anesthesia Type:General  Level of Consciousness: awake and patient cooperative  Airway & Oxygen Therapy: Patient Spontanous Breathing and Patient connected to face mask oxygen  Post-op Assessment: Report given to RN, Post -op Vital signs reviewed and stable, and Patient moving all extremities  Post vital signs: Reviewed and stable  Last Vitals:  Vitals Value Taken Time  BP 147/79 01/03/23 0954  Temp 97.6 01/03/23 0954  Pulse 117 01/03/23 0954  Resp 19 01/03/23 0954  SpO2 100 % 01/03/23 0954  Vitals shown include unvalidated device data.  Last Pain:  Vitals:   01/03/23 0655  TempSrc:   PainSc: 5       Patients Stated Pain Goal: 3 (93/57/01 [id number])  Complications: No notable events documented.

## 2023-01-04 ENCOUNTER — Encounter (HOSPITAL_COMMUNITY): Payer: Self-pay | Admitting: Orthopedic Surgery

## 2023-01-07 NOTE — Op Note (Signed)
01/03/2023  3:21 PM  PATIENT:  Faith Stevens  [birth year] female   MEDICAL RECORD NUMBER: [id number]  PRE-OPERATIVE DIAGNOSIS:  SYMPTOMATIC HARDWARE RIGHT TIBIA  POST-OPERATIVE DIAGNOSIS:  SYMPTOMATIC HARDWARE RIGHT TIBIA  PROCEDURE:   REMOVAL OF DEEP IMPLANT RIGHT TIBIA. MANUAL APPLICATION OF STRESS RIGHT KNEE UNDER FLUOROSCOPY  SURGEON:  Astrid Divine. Marcelino Scot, M.D.  ASSISTANTMarlynn Perking.  ANESTHESIA:  General.  COMPLICATIONS:  None.  TOURNIQUET: None.  SPECIMENS: None.  ESTIMATED BLOOD LOSS:  MIN.  DISPOSITION:  To PACU.  CONDITION:  Stable.  DELAY START OF DVT PROPHYLAXIS BECAUSE OF BLEEDING RISK: NO  BRIEF SUMMARY OF INDICATION FOR PROCEDURE:  Patient is a pleasant 62 y.o. who underwent plate fixation of a fracture with subsequent healing. Despite conservative measures, hardware related symptoms and some medial joint line pain have persisted. Therefore, I discussed with the patient and her husband the risks and benefits of surgical removal including infection, nerve or vessel injury, failure to alleviate symptoms, occult nonunion, re-fracture, progression of arthritis, DVT, PE, and multiple others. They did wish to proceed.   BRIEF SUMMARY OF PROCEDURE:  The patient was taken to the operating room after administration of 2 g of Ancef.  General anesthesia was induced. The right lower extremity was prepped and draped in usual sterile fashion.  No tourniquet was used during the procedure.  C-arm was brought in to confirm position of the hardware.  I remade two parts of the old incision, one proximal and the other distal, then dissected sharply down to the plate, elevating the soft tissues. I identified and removed all screws.  I placed a Cobb over top of the plate and underneath with the sharp edge away from the periosteum to generate some mobility as there were soft tissue connections down to the plate.  The plate was gently rocked to assist with this and then extracted  atraumatically. Final x-rays confirmed removal of all hardware and a healed fracture.   Because of the patient's medial knee pain and original injury pattern suggesting possible instability, a stress evaluation was performed, consisting of varus and valgus force at 30 degrees of flexion. Under live fluoro, I did not identify  any widening of the medial or lateral compartments. Consequently it was deemed stable.  The wounds were irrigated thoroughly and closed in standard fashion with vicryl and nylon. A sterile gently compressive dressing was applied.  The patient was taken to the PACU in stable condition.    PROGNOSIS: Patient will be weightbearing as tolerated with aggressive active and passive motion of the knee and ankle. Bleeding would be anticipated. She may change or remove his dressing in 48 hours and shower. Patient will follow up in 10 days for removal of sutures.       Astrid Divine. Marcelino Scot, M.D.

## 2023-01-16 DIAGNOSIS — M25562 Pain in left knee: Secondary | ICD-10-CM | POA: Diagnosis not present

## 2023-01-17 ENCOUNTER — Other Ambulatory Visit (HOSPITAL_COMMUNITY): Payer: Self-pay

## 2023-01-21 ENCOUNTER — Encounter (INDEPENDENT_AMBULATORY_CARE_PROVIDER_SITE_OTHER): Payer: PRIVATE HEALTH INSURANCE | Admitting: Internal Medicine

## 2023-01-23 ENCOUNTER — Encounter: Payer: Self-pay | Admitting: Gastroenterology

## 2023-02-27 ENCOUNTER — Ambulatory Visit: Payer: BC Managed Care – PPO | Admitting: Gastroenterology

## 2023-02-27 ENCOUNTER — Encounter: Payer: Self-pay | Admitting: Gastroenterology

## 2023-02-27 VITALS — BP 138/80 | HR 95 | Ht 64.0 in | Wt 203.0 lb

## 2023-02-27 DIAGNOSIS — K589 Irritable bowel syndrome without diarrhea: Secondary | ICD-10-CM

## 2023-02-27 DIAGNOSIS — K760 Fatty (change of) liver, not elsewhere classified: Secondary | ICD-10-CM | POA: Diagnosis not present

## 2023-02-27 DIAGNOSIS — K580 Irritable bowel syndrome with diarrhea: Secondary | ICD-10-CM | POA: Diagnosis not present

## 2023-02-27 DIAGNOSIS — Z86010 Personal history of colonic polyps: Secondary | ICD-10-CM

## 2023-02-27 NOTE — Progress Notes (Signed)
HPI : Faith Stevens is a very pleasant 62 year old female with a history of anxiety and IBS who presents to our clinic today to establish care for ongoing management of her IBS.  She recently moved to [place] from [place] where she was followed by a gastroenterologist and underwent an extensive negative work up.  She reports having problems with episodic abdominal pain and diarrhea starting in 2017.  In between these episodes, she has little to no problems with pain or diarrhea.  She will typically have formed stools, usually one every 2-3 days, but she denies feel constipated and denies problems with straining or difficulty evacuating.  When she has the diarrhea, it is profuse and watery and very difficult to control.  She often sees mucus, but no blood.  Incontinence is a frequent problem.  The episodes typically last about a week.  They are infrequent, occurring about 4 times per on average.  Her last bout was a month ago.  She has identified stress as a trigger for her symptoms.  She has not identified any dietary triggers for her symptoms. During these episodes she limits her PO intake, and the diarrhea improves.  The takes Bentyl, Levsin, pepto bismol and peppermint tea which help with the crampy pain.     Previous GI evaluation (approximately 50 pages of printed records provided by the patient)   Labs 11/19/2022 TSH 1.6 CRP 1.5 (0-3) Hepatic:  TP 7.2, Alb 4.9, AST 21, ALT 21, ALP 97, tbili 0.44 BMP:  unremarkable 25-OH 53.8 CBC:  WBC 5, Hgb 13, Hct 40, Plt 219, MCV 91 A1c 5.35  Colonoscopy Nov 2021 3 mm tubular adenoma (ascending colon) Fixed, tortuous sigmoid colon with a few diverticula Recommended repeat colonoscopy in 7 years  Elastography May 25, 2020 Stiffness:  3.4kPa (normal) CAP 232 (normal)   CT abdomen/pelvis with IV/PO contrast April 03, 2021 Impression No evidence of interval development of active inflammatory process or masses in the abdomen or  pelvis No evidence of interval development of enlarged lymphadenopathy.  Nonspecific nonenlarged mesenteric root and ileocolic chain lymph nodes without significant change Hepatomegaly (17.6cm) S/p cholecystectomy Interval hysterectomy Small bilateral fat containing left posteromedial diaphragmatic Bochdalek hernias   CT abdomen/pelvis with IV contrast July 23, 2019 Impression No evidence of interval development of urolithiasis Nonspecific nonenlarged mesenteric root and ileocolic chain lymph nodes No evidence of interval development of noncalcified masses in the abdomen or pelvis.  No arterial phase images were obtained in reference to a subcentimeter flash hepatic hemangioma reported on 03/17/2018 outside imaging center CT scans Hepatomegaly (18 cm) Interval cholecystectomy Subcentimeter deep intramural anterofundal uterine calcification, presumably consistent with a leiomyoma given larger bilateral intramural uterine hypoenhancing noncalcified leiomyomas Small rectocele.  Abdominal Ultrasound Feb 10, 2019 Hepatic steatosis but no hepatomegaly (12.6 cm) Cholelithiasis  CT abdomen/pelvis with IV/PO contrast Mar 17, 2018 Findings include 9 mm early enhancing nodular lesion seen only in arterial phase Impression No acute process seen in the abdomen or pelvis  Abdominal ultrasound Jan 08, 2018 Enlarged fatty liver (17.8 cm long), increased echotexture Cholelithiasis  CT abdomen/pelvis with IV/PO contrast Nov 21, 2016 Impression No acute inflammatory process is visualized within the abdomen or pelvis (Liver reported as normal in size without focal lesion)  Abdominal ultrasound  Sept 26, 2016 Distended gallbladder with gallstones present, largest 2.1 cm Liver is normal in size, contour and echotexture.  Colonoscopy Nov 03, 2015 Indication:  Hematochezia Sigmoid diverticulosis Technically difficult angulated, redundant colon requiring intubating segments several  times to see all  the walls.   No polyps seen Recommendations included annual fecal occult blood test and repeat colonoscopy in 5 years  EGD Jun 23, 2015 Indication:  Dyspepsia Small hiatal hernia Antral granularity, biopsied  (reactive changes, H. Pylori neg) Fishscale pattern of edema and erythema in the body and fundus, biopsied (minimally inflamed gastric mucosa, H. Pylori neg)  MRCP April 27, 2009 Impression No evidence of cholelithiasis, choledocholithiasis or biliary obstruction Mild hepatomegaly Mild bilateral collecting system and proxima ureteral dilation.  It is not clear if this finding may be obtained with unenhanced and contrast-enhanced CT scan of the abdomen and pelvis to evaluate for obstructing process.  Query right ureteral filling defect?  Abdominal ultrasound March 16, 2009 Normal upper abdominal sonogram (reported normal liver in size, contour and echotexture)  EGD Mar 02, 2008 Indication: Dyspepsia Hiatal hernia with linear erosions Antral granularity, biopsied (normal gastric mucosa with lymphoid aggregates, H. Pylori neg) Otherwise normal   Past Medical History:  Diagnosis Date   Anxiety 08/14/2021   Arthritis    Essential hypertension 08/14/2021   HTN (hypertension) with goal to be determined      Past Surgical History:  Procedure Laterality Date   ABDOMINAL HYSTERECTOMY     Total   CHOLECYSTECTOMY     EXTERNAL FIXATION LEG Right 08/13/2021   Procedure: EXTERNAL FIXATION LEG;  Surgeon: Altamese Peters, MD;  Location: [facility];  Service: Orthopedics;  Laterality: Right;   EXTERNAL FIXATION REMOVAL Right 08/24/2021   Procedure: REMOVAL EXTERNAL FIXATION LEG;  Surgeon: Altamese Paw[place], MD;  Location: [facility];  Service: Orthopedics;  Laterality: Right;   HARDWARE REMOVAL Right 01/03/2023   Procedure: REMOVAL OF HARDWARE RIGHT KNEE;  Surgeon: Altamese [place], MD;  Location: [facility];  Service: Orthopedics;  Laterality: Right;   ORIF TIBIA PLATEAU Right 08/24/2021   Procedure: OPEN  REDUCTION INTERNAL FIXATION (ORIF) TIBIAL PLATEAU;  Surgeon: Altamese , MD;  Location: [facility];  Service: Orthopedics;  Laterality: Right;   Family History  Problem Relation Age of Onset   CVA Mother    Colon cancer Neg Hx    Esophageal cancer Neg Hx    Stomach cancer Neg Hx    Social History   Tobacco Use   Smoking status: Never   Smokeless tobacco: Never  Vaping Use   Vaping Use: Never used  Substance Use Topics   Alcohol use: Never   Drug use: Never   Current Outpatient Medications  Medication Sig Dispense Refill   acetaminophen (TYLENOL) 500 MG tablet Take 1-2 tablets (500-1,000 mg total) by mouth every 8 (eight) hours as needed for moderate pain. 30 tablet 0   ALPRAZolam (XANAX) 1 MG tablet Take 0.5 mg by mouth daily as needed for anxiety.     amLODipine (NORVASC) 2.5 MG tablet Take 2.5 mg by mouth every evening.     B Complex-C (B-COMPLEX WITH VITAMIN C) tablet Take 1 tablet by mouth daily.     Cholecalciferol (VITAMIN D3 PO) Take 1 tablet by mouth daily.     diclofenac Sodium (VOLTAREN) 1 % GEL Apply 1 Application topically 2 (two) times daily as needed (joint pain).     dicyclomine (BENTYL) 20 MG tablet Take 20 mg by mouth as needed.     hyoscyamine (LEVSIN SL) 0.125 MG SL tablet Place under the tongue as needed.     ketorolac (TORADOL) 10 MG tablet Take 1 tablet (10 mg total) by mouth every 6 (six) hours as needed for moderate pain. [place]  tablet 0   olmesartan (BENICAR) 20 MG tablet Take 20 mg by mouth daily.     No current facility-administered medications for this visit.   Allergies  Allergen Reactions   Latex Swelling     Review of Systems: All systems reviewed and negative except where noted in HPI.    No results found.  Physical Exam: BP 138/80   Pulse 95   Ht '5\' 4"'$  (1.626 m)   Wt 203 lb (92.1 kg)   SpO2 99%   BMI 34.84 kg/m  Constitutional: Pleasant,well-developed, Latino female in no acute distress. HEENT: Normocephalic and atraumatic.  Conjunctivae are normal. No scleral icterus. Neck supple.  Cardiovascular: Normal rate, regular rhythm.  Pulmonary/chest: Effort normal and breath sounds normal. No wheezing, rales or rhonchi. Abdominal: Soft, nondistended, nontender. Bowel sounds active throughout. There are no masses palpable. No hepatomegaly. Extremities: no edema Neurological: Alert and oriented to person place and time. Skin: Skin is warm and dry. No rashes noted. Psychiatric: Normal mood and affect. Behavior is normal.  CBC    Component Value Date/Time   WBC 7.0 01/03/2023 0613   RBC 4.23 01/03/2023 0613   HGB 12.9 01/03/2023 0613   HCT 39.4 01/03/2023 0613   PLT 247 01/03/2023 0613   MCV 93.1 01/03/2023 0613   MCH 30.5 01/03/2023 0613   MCHC 32.7 01/03/2023 0613   RDW 12.1 01/03/2023 0613   LYMPHSABS 1.3 08/24/2021 0549   MONOABS 0.5 08/24/2021 0549   EOSABS 0.1 08/24/2021 0549   BASOSABS 0.1 08/24/2021 0549    CMP     Component Value Date/Time   NA 138 01/03/2023 0613   K 4.1 01/03/2023 0613   CL 101 01/03/2023 0613   CO2 27 01/03/2023 0613   GLUCOSE 114 (H) 01/03/2023 0613   BUN 16 01/03/2023 0613   CREATININE 0.73 01/03/2023 0613   CALCIUM 9.0 01/03/2023 0613   PROT 6.9 08/24/2021 0549   ALBUMIN 3.9 08/24/2021 0549   AST 21 08/24/2021 0549   ALT 39 08/24/2021 0549   ALKPHOS 77 08/24/2021 0549   BILITOT 0.9 08/24/2021 0549   GFRNONAA >60 01/03/2023 [id number]     ASSESSMENT AND PLAN: 62 year old female with long standing history of GI complaints who carries a diagnosis of IBS-D, who has had extensive imaging and endoscopic evaluation as outlined above, who is largely asymptomatic for most of the time, but has episodes of significant abdominal pain and diarrhea. Currently she feels fine and she does not need to take any medications for her IBS except during these episodes.  She follows a regular diet and does not have symptoms usually.  Given infrequent episodes, I would not recommend a TCA.  No  further evaluation recommended at this time.  Patient will call when she needs a refill of her Bentyl or Levsin. She will be due colon cancer screening again in Nov 2028.  IBS-D - Continue current management for flare symptoms (anti-spasmotics, anti-diarrheals, dietary modification) - No further evaluation or treatment recommended  Colon cancer screening/polyp surveillance - Will be due Nov 2028 (reminder set)  Fatty liver - Most recent elastography (2021) showed no signficant fatty change and no fibrosis - Most recent liver enzymes normal  Michaeal Davis E. Candis Schatz, MD [facility]  I spent a total of 55 minutes reviewing the patient's medical record, interviewing and examining the patient, discussing her diagnosis and management of her condition going forward, and documenting in the medical record  No ref. provider found

## 2023-02-27 NOTE — Patient Instructions (Addendum)
_______________________________________________________  If your blood pressure at your visit was 140/90 or greater, please contact your primary care physician to follow up on this.  _______________________________________________________  If you are age 60 or older, your body mass index should be between 23-30. Your Body mass index is 34.84 kg/m. If this is out of the aforementioned range listed, please consider follow up with your Primary Care Provider.  If you are age 60 or younger, your body mass index should be between 19-25. Your Body mass index is 34.84 kg/m. If this is out of the aformentioned range listed, please consider follow up with your Primary Care Provider.   ________________________________________________________  The [facility] providers would like to encourage you to use [facility] to communicate with providers for non-urgent requests or questions.  Due to long hold times on the telephone, sending your provider a message by [facility] may be a faster and more efficient way to get a response.  Please allow 48 business hours for a response.  Please remember that this is for non-urgent requests.  _______________________________________________________  Take Imodium as needed. Follow up as needed

## 2023-03-04 ENCOUNTER — Telehealth: Payer: Self-pay | Admitting: Emergency Medicine

## 2023-03-04 ENCOUNTER — Encounter: Payer: Self-pay | Admitting: Emergency Medicine

## 2023-03-04 ENCOUNTER — Ambulatory Visit: Payer: BC Managed Care – PPO | Admitting: Emergency Medicine

## 2023-03-04 VITALS — BP 126/78 | HR 93 | Temp 98.3°F | Ht 64.0 in | Wt 209.0 lb

## 2023-03-04 DIAGNOSIS — Z1231 Encounter for screening mammogram for malignant neoplasm of breast: Secondary | ICD-10-CM

## 2023-03-04 DIAGNOSIS — F419 Anxiety disorder, unspecified: Secondary | ICD-10-CM | POA: Diagnosis not present

## 2023-03-04 DIAGNOSIS — F5104 Psychophysiologic insomnia: Secondary | ICD-10-CM | POA: Diagnosis not present

## 2023-03-04 DIAGNOSIS — I10 Essential (primary) hypertension: Secondary | ICD-10-CM

## 2023-03-04 DIAGNOSIS — Z7689 Persons encountering health services in other specified circumstances: Secondary | ICD-10-CM

## 2023-03-04 DIAGNOSIS — Z8719 Personal history of other diseases of the digestive system: Secondary | ICD-10-CM | POA: Insufficient documentation

## 2023-03-04 MED ORDER — TRAZODONE HCL 50 MG PO TABS: 50.0000 mg | ORAL_TABLET | Freq: Every day | ORAL | 5 refills | Status: DC

## 2023-03-04 MED ORDER — OLMESARTAN MEDOXOMIL 20 MG PO TABS: 20.0000 mg | ORAL_TABLET | Freq: Every day | ORAL | 3 refills | Status: DC

## 2023-03-04 MED ORDER — AMLODIPINE BESYLATE 2.5 MG PO TABS: 2.5000 mg | ORAL_TABLET | Freq: Every evening | ORAL | 3 refills | Status: DC

## 2023-03-04 NOTE — Assessment & Plan Note (Signed)
Well-controlled.  Sees GI doctor on a regular basis Presently on Levsin sublingual as needed and Bentyl 20 mg as needed

## 2023-03-04 NOTE — Telephone Encounter (Signed)
New medication sent to patient requested pharmacy

## 2023-03-04 NOTE — Progress Notes (Signed)
Faith Stevens 62 y.o.   Chief Complaint  Patient presents with   Establish Care    Concerns about BP medication and Anxiety medication     HISTORY OF PRESENT ILLNESS: This is a 62 y.o. female first visit to this office, here to establish care with me. Has history of hypertension on Benicar and amlodipine History of chronic anxiety.  Takes alprazolam as needed but almost daily.  Helps her sleep. Also has history of IBS-D. Recent right knee fracture. No other complaints or medical concerns today. BP Readings from Last 3 Encounters:  03/04/23 126/78  02/27/23 138/80  01/03/23 (!) 158/80   Wt Readings from Last 3 Encounters:  03/04/23 209 lb (94.8 kg)  02/27/23 203 lb (92.1 kg)  01/03/23 203 lb (92.1 kg)     HPI   Prior to Admission medications   Medication Sig Start Date End Date Taking? Authorizing Provider  ALPRAZolam Duanne Moron) 1 MG tablet Take 0.5 mg by mouth daily as needed for anxiety.   Yes [provider]  amLODipine (NORVASC) 2.5 MG tablet Take 2.5 mg by mouth every evening.   Yes [provider]  B Complex-C (B-COMPLEX WITH VITAMIN C) tablet Take 1 tablet by mouth daily.   Yes [provider]  Cholecalciferol (VITAMIN D3 PO) Take 1 tablet by mouth daily.   Yes [provider]  diclofenac Sodium (VOLTAREN) 1 % GEL Apply 1 Application topically 2 (two) times daily as needed (joint pain).   Yes [provider]  dicyclomine (BENTYL) 20 MG tablet Take 20 mg by mouth as needed. 01/08/23  Yes [provider]  hyoscyamine (LEVSIN SL) 0.125 MG SL tablet Place under the tongue as needed. 01/08/23  Yes [provider]  ketorolac (TORADOL) 10 MG tablet Take 1 tablet (10 mg total) by mouth every 6 (six) hours as needed for moderate pain. 01/03/23  Yes Ainsley Spinner, PA-C  olmesartan (BENICAR) 20 MG tablet Take 20 mg by mouth daily.   Yes [provider]    Allergies  Allergen Reactions   Latex Swelling     Patient Active Problem List   Diagnosis Date Noted   Essential hypertension 08/14/2021   Anxiety 08/14/2021    Past Medical History:  Diagnosis Date   Anxiety 08/14/2021   Arthritis    Essential hypertension 08/14/2021   HTN (hypertension) with goal to be determined     Past Surgical History:  Procedure Laterality Date   ABDOMINAL HYSTERECTOMY     Total   CHOLECYSTECTOMY     EXTERNAL FIXATION LEG Right 08/13/2021   Procedure: EXTERNAL FIXATION LEG;  Surgeon: Altamese Wyndmoor, MD;  Location: [facility];  Service: Orthopedics;  Laterality: Right;   EXTERNAL FIXATION REMOVAL Right 08/24/2021   Procedure: REMOVAL EXTERNAL FIXATION LEG;  Surgeon: Altamese Hopewell, MD;  Location: [facility];  Service: Orthopedics;  Laterality: Right;   HARDWARE REMOVAL Right 01/03/2023   Procedure: REMOVAL OF HARDWARE RIGHT KNEE;  Surgeon: Altamese [place], MD;  Location: [place];  Service: Orthopedics;  Laterality: Right;   ORIF TIBIA PLATEAU Right 08/24/2021   Procedure: OPEN REDUCTION INTERNAL FIXATION (ORIF) TIBIAL PLATEAU;  Surgeon: Altamese [place], MD;  Location: [facility];  Service: Orthopedics;  Laterality: Right;    Social History   Socioeconomic History   Marital status: Married    Spouse name: Not on file   Number of children: 0   Years of education: Not on file   Highest education level: Not on file  Occupational History  Not on file  Tobacco Use   Smoking status: Never   Smokeless tobacco: Never  Vaping Use   Vaping Use: Never used  Substance and Sexual Activity   Alcohol use: Never   Drug use: Never   Sexual activity: Not on file  Other Topics Concern   Not on file  Social History Narrative   Not on file   Social Determinants of Health   Financial Resource Strain: Not on file  Food Insecurity: Not on file  Transportation Needs: Not on file  Physical Activity: Not on file  Stress: Not on file  Social Connections: Not on file  Intimate Partner Violence: Not on file    Family  History  Problem Relation Age of Onset   CVA Mother    Colon cancer Neg Hx    Esophageal cancer Neg Hx    Stomach cancer Neg Hx      Review of Systems  Constitutional: Negative.  Negative for chills and fever.  HENT: Negative.  Negative for congestion and sore throat.   Respiratory: Negative.  Negative for cough and shortness of breath.   Cardiovascular: Negative.  Negative for chest pain and palpitations.  Gastrointestinal:  Negative for abdominal pain, nausea and vomiting.  Genitourinary: Negative.  Negative for dysuria and hematuria.  Skin: Negative.  Negative for rash.  Neurological: Negative.  Negative for dizziness and headaches.  Psychiatric/Behavioral:  The patient has insomnia.   All other systems reviewed and are negative.  Today's Vitals   03/04/23 1053  BP: 126/78  Pulse: 93  Temp: 98.3 F (36.8 C)  TempSrc: Oral  SpO2: 99%  Weight: 209 lb (94.8 kg)  Height: '5\' 4"'$  (1.626 m)   Body mass index is 35.87 kg/m.   Physical Exam Vitals reviewed.  Constitutional:      Appearance: Normal appearance.  HENT:     Head: Normocephalic.  Eyes:     Extraocular Movements: Extraocular movements intact.  Cardiovascular:     Rate and Rhythm: Normal rate.  Pulmonary:     Effort: Pulmonary effort is normal.  Skin:    General: Skin is warm and dry.  Neurological:     Mental Status: She is alert and oriented to person, place, and time.  Psychiatric:        Mood and Affect: Mood normal.        Behavior: Behavior normal.     ASSESSMENT & PLAN: A total of 47 minutes was spent with the patient and counseling/coordination of care regarding preparing for this visit, review of available medical records, review of chronic medical conditions under management, review of all medications, comprehensive medical history, treatment of chronic insomnia and hypertension, prognosis, documentation, and need for follow-up.  Problem List Items Addressed This Visit       Cardiovascular  and Mediastinum   Essential hypertension - Primary    Well-controlled hypertension. Continue olmesartan 20 mg daily and amlodipine 2.5 mg daily. Cardiovascular risk associated with hypertension discussed.      Relevant Medications   olmesartan (BENICAR) 20 MG tablet     Other   Chronic insomnia    Has tried several over-the-counter medications in the past. Does not like side effects of Ambien or Lunesta Using alprazolam to help with anxiety and sleep. Discourage use of daily benzodiazepines. States she does not use it every day. I recommend instead to use trazodone 50 mg at bedtime      Relevant Medications   traZODone (DESYREL) 50 MG tablet   Chronic  anxiety    Stable and well-controlled.      Relevant Medications   traZODone (DESYREL) 50 MG tablet   History of IBS    Well-controlled.  Sees GI doctor on a regular basis Presently on Levsin sublingual as needed and Bentyl 20 mg as needed      Other Visit Diagnoses     Encounter to establish care          Patient Instructions  Health Maintenance, Female Adopting a healthy lifestyle and getting preventive care are important in promoting health and wellness. Ask your health care provider about: The right schedule for you to have regular tests and exams. Things you can do on your own to prevent diseases and keep yourself healthy. What should I know about diet, weight, and exercise? Eat a healthy diet  Eat a diet that includes plenty of vegetables, fruits, low-fat dairy products, and lean protein. Do not eat a lot of foods that are high in solid fats, added sugars, or sodium. Maintain a healthy weight Body mass index (BMI) is used to identify weight problems. It estimates body fat based on height and weight. Your health care provider can help determine your BMI and help you achieve or maintain a healthy weight. Get regular exercise Get regular exercise. This is one of the most important things you can do for your  health. Most adults should: Exercise for at least 150 minutes each week. The exercise should increase your heart rate and make you sweat (moderate-intensity exercise). Do strengthening exercises at least twice a week. This is in addition to the moderate-intensity exercise. Spend less time sitting. Even light physical activity can be beneficial. Watch cholesterol and blood lipids Have your blood tested for lipids and cholesterol at 61 years of age, then have this test every 5 years. Have your cholesterol levels checked more often if: Your lipid or cholesterol levels are high. You are older than 60 years of age. You are at high risk for heart disease. What should I know about cancer screening? Depending on your health history and family history, you may need to have cancer screening at various ages. This may include screening for: Breast cancer. Cervical cancer. Colorectal cancer. Skin cancer. Lung cancer. What should I know about heart disease, diabetes, and high blood pressure? Blood pressure and heart disease High blood pressure causes heart disease and increases the risk of stroke. This is more likely to develop in people who have high blood pressure readings or are overweight. Have your blood pressure checked: Every 3-5 years if you are 56-10 years of age. Every year if you are 30 years old or older. Diabetes Have regular diabetes screenings. This checks your fasting blood sugar level. Have the screening done: Once every three years after age 79 if you are at a normal weight and have a low risk for diabetes. More often and at a younger age if you are overweight or have a high risk for diabetes. What should I know about preventing infection? Hepatitis B If you have a higher risk for hepatitis B, you should be screened for this virus. Talk with your health care provider to find out if you are at risk for hepatitis B infection. Hepatitis C Testing is recommended for: Everyone born  from [birth year] through 1963. Anyone with known risk factors for hepatitis C. Sexually transmitted infections (STIs) Get screened for STIs, including gonorrhea and chlamydia, if: You are sexually active and are younger than 60 years of age. You are older  than 60 years of age and your health care provider tells you that you are at risk for this type of infection. Your sexual activity has changed since you were last screened, and you are at increased risk for chlamydia or gonorrhea. Ask your health care provider if you are at risk. Ask your health care provider about whether you are at high risk for HIV. Your health care provider may recommend a prescription medicine to help prevent HIV infection. If you choose to take medicine to prevent HIV, you should first get tested for HIV. You should then be tested every 3 months for as long as you are taking the medicine. Pregnancy If you are about to stop having your period (premenopausal) and you may become pregnant, seek counseling before you get pregnant. Take 400 to 800 micrograms (mcg) of folic acid every day if you become pregnant. Ask for birth control (contraception) if you want to prevent pregnancy. Osteoporosis and menopause Osteoporosis is a disease in which the bones lose minerals and strength with aging. This can result in bone fractures. If you are 28 years old or older, or if you are at risk for osteoporosis and fractures, ask your health care provider if you should: Be screened for bone loss. Take a calcium or vitamin D supplement to lower your risk of fractures. Be given hormone replacement therapy (HRT) to treat symptoms of menopause. Follow these instructions at home: Alcohol use Do not drink alcohol if: Your health care provider tells you not to drink. You are pregnant, may be pregnant, or are planning to become pregnant. If you drink alcohol: Limit how much you have to: 0-1 drink a day. Know how much alcohol is in your drink. In the  [place] one drink equals one 12 oz bottle of beer (355 mL), one 5 oz glass of wine (148 mL), or one 1 oz glass of hard liquor (44 mL). Lifestyle Do not use any products that contain nicotine or tobacco. These products include cigarettes, chewing tobacco, and vaping devices, such as e-cigarettes. If you need help quitting, ask your health care provider. Do not use street drugs. Do not share needles. Ask your health care provider for help if you need support or information about quitting drugs. General instructions Schedule regular health, dental, and eye exams. Stay current with your vaccines. Tell your health care provider if: You often feel depressed. You have ever been abused or do not feel safe at home. Summary Adopting a healthy lifestyle and getting preventive care are important in promoting health and wellness. Follow your health care provider's instructions about healthy diet, exercising, and getting tested or screened for diseases. Follow your health care provider's instructions on monitoring your cholesterol and blood pressure. This information is not intended to replace advice given to you by your health care provider. Make sure you discuss any questions you have with your health care provider. Document Revised: 05/08/2021 Document Reviewed: 05/08/2021 Elsevier Patient Education  Mathews, MD [facility]

## 2023-03-04 NOTE — Assessment & Plan Note (Signed)
Well-controlled hypertension. Continue olmesartan 20 mg daily and amlodipine 2.5 mg daily. Cardiovascular risk associated with hypertension discussed.

## 2023-03-04 NOTE — Assessment & Plan Note (Addendum)
Has tried several over-the-counter medications in the past. Does not like side effects of Ambien or Lunesta Using alprazolam to help with anxiety and sleep. Discourage use of daily benzodiazepines. States she does not use it every day. I recommend instead to use trazodone 50 mg at bedtime

## 2023-03-04 NOTE — Telephone Encounter (Signed)
Patient called and her amlodopine did not get called in today - please send to [facility] on [address] - the rest was called in already.

## 2023-03-04 NOTE — Telephone Encounter (Signed)
Okay to refill the amlodipine

## 2023-03-04 NOTE — Patient Instructions (Signed)

## 2023-03-04 NOTE — Telephone Encounter (Signed)
Patient states she forgot to mention in her appointment that it is time to have her sonomammogram done, she would like a referral put in so she can have it done, states it has to be a sonomammogram.

## 2023-03-04 NOTE — Telephone Encounter (Signed)
Referral for mammogram placed

## 2023-03-04 NOTE — Assessment & Plan Note (Signed)
Stable and well controlled. 

## 2023-03-12 DIAGNOSIS — Z01419 Encounter for gynecological examination (general) (routine) without abnormal findings: Secondary | ICD-10-CM | POA: Diagnosis not present

## 2023-03-12 DIAGNOSIS — Z124 Encounter for screening for malignant neoplasm of cervix: Secondary | ICD-10-CM | POA: Diagnosis not present

## 2023-03-12 DIAGNOSIS — Z0000 Encounter for general adult medical examination without abnormal findings: Secondary | ICD-10-CM | POA: Diagnosis not present

## 2023-04-02 ENCOUNTER — Other Ambulatory Visit: Payer: Self-pay | Admitting: Emergency Medicine

## 2023-04-02 ENCOUNTER — Ambulatory Visit
Admission: RE | Admit: 2023-04-02 | Discharge: 2023-04-02 | Disposition: A | Discharge status: Home / Self Care | Payer: BC Managed Care – PPO | Source: Ambulatory Visit | Attending: Emergency Medicine | Admitting: Emergency Medicine

## 2023-04-02 DIAGNOSIS — Z1231 Encounter for screening mammogram for malignant neoplasm of breast: Secondary | ICD-10-CM

## 2023-04-27 ENCOUNTER — Encounter: Payer: Self-pay | Admitting: Gastroenterology

## 2023-04-29 ENCOUNTER — Other Ambulatory Visit: Payer: Self-pay

## 2023-04-29 ENCOUNTER — Encounter: Payer: Self-pay | Admitting: Emergency Medicine

## 2023-04-29 MED ORDER — ONDANSETRON HCL 4 MG PO TABS: 4.0000 mg | ORAL_TABLET | Freq: Four times a day (QID) | ORAL | 1 refills | Status: DC | PRN

## 2023-08-26 ENCOUNTER — Encounter: Payer: Self-pay | Admitting: Emergency Medicine

## 2023-08-26 ENCOUNTER — Ambulatory Visit: Payer: BC Managed Care – PPO | Admitting: Emergency Medicine

## 2023-08-26 VITALS — BP 130/78 | HR 88 | Temp 98.2°F | Ht 64.0 in | Wt 203.4 lb

## 2023-08-26 DIAGNOSIS — M545 Low back pain, unspecified: Secondary | ICD-10-CM | POA: Insufficient documentation

## 2023-08-26 DIAGNOSIS — I10 Essential (primary) hypertension: Secondary | ICD-10-CM | POA: Diagnosis not present

## 2023-08-26 DIAGNOSIS — F411 Generalized anxiety disorder: Secondary | ICD-10-CM | POA: Diagnosis not present

## 2023-08-26 DIAGNOSIS — F419 Anxiety disorder, unspecified: Secondary | ICD-10-CM

## 2023-08-26 DIAGNOSIS — F5104 Psychophysiologic insomnia: Secondary | ICD-10-CM

## 2023-08-26 DIAGNOSIS — Z8719 Personal history of other diseases of the digestive system: Secondary | ICD-10-CM | POA: Diagnosis not present

## 2023-08-26 DIAGNOSIS — G8929 Other chronic pain: Secondary | ICD-10-CM

## 2023-08-26 LAB — CBC WITH DIFFERENTIAL/PLATELET
Basophils Absolute: 0 10*3/uL (ref 0.0–0.1)
Basophils Relative: 0.7 % (ref 0.0–3.0)
Eosinophils Absolute: 0 10*3/uL (ref 0.0–0.7)
Eosinophils Relative: 0.5 % (ref 0.0–5.0)
HCT: 37.4 % (ref 36.0–46.0)
Hemoglobin: 12.4 g/dL (ref 12.0–15.0)
Lymphocytes Relative: 27.6 % (ref 12.0–46.0)
Lymphs Abs: 1.6 10*3/uL (ref 0.7–4.0)
MCHC: 33 g/dL (ref 30.0–36.0)
MCV: 91.4 fl (ref 78.0–100.0)
Monocytes Absolute: 0.4 10*3/uL (ref 0.1–1.0)
Monocytes Relative: 7.2 % (ref 3.0–12.0)
Neutro Abs: 3.6 10*3/uL (ref 1.4–7.7)
Neutrophils Relative %: 64 % (ref 43.0–77.0)
Platelets: 225 10*3/uL (ref 150.0–400.0)
RBC: 4.1 Mil/uL (ref 3.87–5.11)
RDW: 13.5 % (ref 11.5–15.5)
WBC: 5.6 10*3/uL (ref 4.0–10.5)

## 2023-08-26 LAB — TSH: TSH: 1.03 u[IU]/mL (ref 0.35–5.50)

## 2023-08-26 LAB — COMPREHENSIVE METABOLIC PANEL
ALT: 15 U/L (ref 0–35)
AST: 17 U/L (ref 0–37)
Albumin: 4.5 g/dL (ref 3.5–5.2)
Alkaline Phosphatase: 80 U/L (ref 39–117)
BUN: 19 mg/dL (ref 6–23)
CO2: 31 mEq/L (ref 19–32)
Calcium: 9.5 mg/dL (ref 8.4–10.5)
Chloride: 102 mEq/L (ref 96–112)
Creatinine, Ser: 0.71 mg/dL (ref 0.40–1.20)
GFR: 92.77 mL/min (ref >60.00)
Glucose, Bld: 101 mg/dL — ABNORMAL HIGH (ref 70–99)
Potassium: 4.2 mEq/L (ref 3.5–5.1)
Sodium: 140 mEq/L (ref 135–145)
Total Bilirubin: 0.3 mg/dL (ref 0.2–1.2)
Total Protein: 6.9 g/dL (ref 6.0–8.3)

## 2023-08-26 LAB — LIPID PANEL
Cholesterol: 150 mg/dL (ref 0–200)
HDL: 52 mg/dL (ref >39.00)
LDL Cholesterol: 79 mg/dL (ref 0–99)
NonHDL: 97.91
Total CHOL/HDL Ratio: 3
Triglycerides: 94 mg/dL (ref 0.0–149.0)
VLDL: 18.8 mg/dL (ref 0.0–40.0)

## 2023-08-26 LAB — HEMOGLOBIN A1C: Hgb A1c MFr Bld: 5.8 % (ref 4.6–6.5)

## 2023-08-26 MED ORDER — SERTRALINE HCL 25 MG PO TABS
25.0000 mg | ORAL_TABLET | Freq: Every day | ORAL | 3 refills | Status: DC
Start: 2023-08-26 — End: 2023-12-27

## 2023-08-26 MED ORDER — AMLODIPINE BESYLATE 5 MG PO TABS
5.0000 mg | ORAL_TABLET | Freq: Every day | ORAL | 3 refills | Status: DC
Start: 2023-08-26 — End: 2024-08-27

## 2023-08-26 NOTE — Assessment & Plan Note (Signed)
Very stressful lifestyle situations Has history of chronic anxiety affecting quality of life Recommend psychiatric evaluation.  Referral placed today Recommend to start Zoloft 25 mg daily.  Will increase as needed.

## 2023-08-26 NOTE — Progress Notes (Signed)
Faith Stevens 62 y.o.   Chief Complaint  Patient presents with   Medical Management of Chronic Issues    f/u appt, patient has several concerns     HISTORY OF PRESENT ILLNESS: This is a 62 y.o. female here for follow-up of chronic medical problems. #1 hypertension.  Some elevated blood pressure readings at home #2 history of IBS.  Sees GI doctor on a regular basis #3 chronic low back pain needs Ortho referral #4 "cramp in neck area" no trouble swallowing #5 trazodone not working #6 General Anxiety.  Uses alprazolam as needed.  Very stressful lifestyle No other complaints or medical concerns today.  HPI   Prior to Admission medications   Medication Sig Start Date End Date Taking? Authorizing Provider  amLODipine (NORVASC) 2.5 MG tablet Take 1 tablet (2.5 mg total) by mouth every evening. 03/04/23  Yes Lanyiah Brix, Eilleen Kempf, MD  B Complex-C (B-COMPLEX WITH VITAMIN C) tablet Take 1 tablet by mouth daily.   Yes [provider]  Cholecalciferol (VITAMIN D3 PO) Take 1 tablet by mouth daily.   Yes [provider]  diclofenac Sodium (VOLTAREN) 1 % GEL Apply 1 Application topically 2 (two) times daily as needed (joint pain).   Yes [provider]  dicyclomine (BENTYL) 20 MG tablet Take 20 mg by mouth as needed. 01/08/23  Yes [provider]  hyoscyamine (LEVSIN SL) 0.125 MG SL tablet Place under the tongue as needed. 01/08/23  Yes [provider]  ketorolac (TORADOL) 10 MG tablet Take 1 tablet (10 mg total) by mouth every 6 (six) hours as needed for moderate pain. 01/03/23  Yes Montez Morita, PA-C  olmesartan (BENICAR) 20 MG tablet Take 1 tablet (20 mg total) by mouth daily. 03/04/23  Yes Broox Lonigro, Eilleen Kempf, MD  ondansetron (ZOFRAN) 4 MG tablet Take 1 tablet (4 mg total) by mouth every 6 (six) hours as needed for nausea or vomiting. 04/29/23  Yes Jenel Lucks, MD  traZODone (DESYREL) 50 MG tablet Take 1 tablet (50 mg total) by mouth at  bedtime. 03/04/23  Yes Tashe Purdon, Eilleen Kempf, MD  ALPRAZolam Prudy Feeler) 1 MG tablet Take 0.5 mg by mouth daily as needed for anxiety. Patient not taking: Reported on 08/26/2023    [provider]    Allergies  Allergen Reactions   Latex Swelling    Patient Active Problem List   Diagnosis Date Noted   Chronic insomnia 03/04/2023   Chronic anxiety 03/04/2023   History of IBS 03/04/2023   Essential hypertension 08/14/2021   Anxiety 08/14/2021    Past Medical History:  Diagnosis Date   Anxiety 08/14/2021   Arthritis    Essential hypertension 08/14/2021   HTN (hypertension) with goal to be determined     Past Surgical History:  Procedure Laterality Date   ABDOMINAL HYSTERECTOMY     Total   CHOLECYSTECTOMY     EXTERNAL FIXATION LEG Right 08/13/2021   Procedure: EXTERNAL FIXATION LEG;  Surgeon: Myrene Galas, MD;  Location: [facility] OR;  Service: Orthopedics;  Laterality: Right;   EXTERNAL FIXATION REMOVAL Right 08/24/2021   Procedure: REMOVAL EXTERNAL FIXATION LEG;  Surgeon: Myrene Galas, MD;  Location: [facility] OR;  Service: Orthopedics;  Laterality: Right;   HARDWARE REMOVAL Right 01/03/2023   Procedure: REMOVAL OF HARDWARE RIGHT KNEE;  Surgeon: Myrene Galas, MD;  Location: [facility] OR;  Service: Orthopedics;  Laterality: Right;   ORIF TIBIA PLATEAU Right 08/24/2021   Procedure: OPEN REDUCTION INTERNAL FIXATION (ORIF) TIBIAL PLATEAU;  Surgeon: Myrene Galas, MD;  Location: [facility] OR;  Service: Orthopedics;  Laterality: Right;    Social History   Socioeconomic History   Marital status: Married    Spouse name: Not on file   Number of children: 0   Years of education: Not on file   Highest education level: Master's degree (e.g., MA, MS, MEng, MEd, MSW, MBA)  Occupational History   Not on file  Tobacco Use   Smoking status: Never   Smokeless tobacco: Never  Vaping Use   Vaping status: Never Used  Substance and Sexual Activity   Alcohol use: Never   Drug use: Never   Sexual  activity: Not on file  Other Topics Concern   Not on file  Social History Narrative   Not on file   Social Determinants of Health   Financial Resource Strain: Low Risk  (08/22/2023)   Overall Financial Resource Strain (CARDIA)    Difficulty of Paying Living Expenses: Not hard at all  Food Insecurity: No Food Insecurity (08/22/2023)   Hunger Vital Sign    Worried About Running Out of Food in the Last Year: Never true    Ran Out of Food in the Last Year: Never true  Transportation Needs: No Transportation Needs (08/22/2023)   PRAPARE - Administrator, Civil Service (Medical): No    Lack of Transportation (Non-Medical): No  Physical Activity: Insufficiently Active (08/22/2023)   Exercise Vital Sign    Days of Exercise per Week: 3 days    Minutes of Exercise per Session: 20 min  Stress: Stress Concern Present (08/22/2023)   [employer] of Occupational Health - Occupational Stress Questionnaire    Feeling of Stress : Rather much  Social Connections: Unknown (08/22/2023)   Social Connection and Isolation Panel [NHANES]    Frequency of Communication with Friends and Family: Twice a week    Frequency of Social Gatherings with Friends and Family: Patient declined    Attends Religious Services: Never    Database administrator or Organizations: No    Attends Engineer, structural: Not on file    Marital Status: Married  Catering manager Violence: Not on file    Family History  Problem Relation Age of Onset   CVA Mother    Colon cancer Neg Hx    Esophageal cancer Neg Hx    Stomach cancer Neg Hx      Review of Systems  Constitutional: Negative.  Negative for chills and fever.  HENT: Negative.  Negative for congestion and sore throat.   Respiratory: Negative.  Negative for cough and shortness of breath.   Cardiovascular: Negative.  Negative for chest pain and palpitations.  Gastrointestinal:  Negative for abdominal pain, diarrhea, nausea and vomiting.   Genitourinary: Negative.  Negative for dysuria and hematuria.  Skin: Negative.  Negative for rash.  Neurological: Negative.  Negative for dizziness and headaches.  All other systems reviewed and are negative.   Vitals:   08/26/23 1314  BP: 130/78  Pulse: 88  Temp: 98.2 F (36.8 C)  SpO2: 98%    Physical Exam Vitals reviewed.  Constitutional:      Appearance: Normal appearance.  HENT:     Head: Normocephalic.  Eyes:     Extraocular Movements: Extraocular movements intact.  Cardiovascular:     Rate and Rhythm: Normal rate.  Pulmonary:     Effort: Pulmonary effort is normal.  Skin:    General: Skin is warm and dry.  Neurological:     Mental Status: She  is alert and oriented to person, place, and time.  Psychiatric:        Mood and Affect: Mood normal.        Behavior: Behavior normal.      ASSESSMENT & PLAN: A total of 42 minutes was spent with the patient and counseling/coordination of care regarding preparing for this visit, review of most recent office visit notes, review of multiple chronic medical conditions and their management, review of all medications, need for blood work today, diagnosis of generalized anxiety disorder and need for psychiatric evaluation, need for medication, prognosis, documentation, and need for follow-up.  Problem List Items Addressed This Visit       Cardiovascular and Mediastinum   Essential hypertension - Primary    Elevated blood pressure readings at home Recommend to increase amlodipine to 5 mg daily New prescription sent to pharmacy of record today Blood work done today.      Relevant Medications   amLODipine (NORVASC) 5 MG tablet   Other Relevant Orders   CBC with Differential/Platelet   Comprehensive metabolic panel   Hemoglobin A1c   Lipid panel   TSH     Other   Chronic insomnia    Trazodone not working.  Recommend to stop it. High stress in her life. Sleep hygiene discussed      Chronic anxiety    Very  stressful lifestyle situations Has history of chronic anxiety affecting quality of life Recommend psychiatric evaluation.  Referral placed today Recommend to start Zoloft 25 mg daily.  Will increase as needed.      Relevant Medications   sertraline (ZOLOFT) 25 MG tablet   History of IBS    Stable and well-controlled.  Sees GI doctor on a regular basis Recommend upper endoscopy also for dysphagia      Chronic bilateral low back pain without sciatica    Chronic and affecting quality of life. Recommend orthopedic evaluation Pain management discussed Referral placed today      Relevant Medications   sertraline (ZOLOFT) 25 MG tablet   Other Relevant Orders   Ambulatory referral to Orthopedic Surgery   Generalized anxiety disorder    Chronic and affecting quality of life Recommend to start Zoloft 25 mg daily Psychiatric referral placed today      Relevant Medications   sertraline (ZOLOFT) 25 MG tablet   Other Relevant Orders   Ambulatory referral to Psychiatry   Patient Instructions  Generalized Anxiety Disorder, Adult Generalized anxiety disorder (GAD) is a mental health condition. Unlike normal worries, anxiety related to GAD is not triggered by a specific event. These worries do not fade or get better with time. GAD interferes with relationships, work, and school. GAD symptoms can vary from mild to severe. People with severe GAD can have intense waves of anxiety with physical symptoms that are similar to panic attacks. What are the causes? The exact cause of GAD is not known, but the following are believed to have an impact: Differences in natural brain chemicals. Genes passed down from parents to children. Differences in the way threats are perceived. Development and stress during childhood. Personality. What increases the risk? The following factors may make you more likely to develop this condition: Being female. Having a family history of anxiety disorders. Being  very shy. Experiencing very stressful life events, such as the death of a loved one. Having a very stressful family environment. What are the signs or symptoms? People with GAD often worry excessively about many things in their lives, such  as their health and family. Symptoms may also include: Mental and emotional symptoms: Worrying excessively about natural disasters. Fear of being late. Difficulty concentrating. Fears that others are judging your performance. Physical symptoms: Fatigue. Headaches, muscle tension, muscle twitches, trembling, or feeling shaky. Feeling like your heart is pounding or beating very fast. Feeling out of breath or like you cannot take a deep breath. Having trouble falling asleep or staying asleep, or experiencing restlessness. Sweating. Nausea, diarrhea, or irritable bowel syndrome (IBS). Behavioral symptoms: Experiencing erratic moods or irritability. Avoidance of new situations. Avoidance of people. Extreme difficulty making decisions. How is this diagnosed? This condition is diagnosed based on your symptoms and medical history. You will also have a physical exam. Your health care provider may perform tests to rule out other possible causes of your symptoms. To be diagnosed with GAD, a person must have anxiety that: Is out of his or her control. Affects several different aspects of his or her life, such as work and relationships. Causes distress that makes him or her unable to take part in normal activities. Includes at least three symptoms of GAD, such as restlessness, fatigue, trouble concentrating, irritability, muscle tension, or sleep problems. Before your health care provider can confirm a diagnosis of GAD, these symptoms must be present more days than they are not, and they must last for 6 months or longer. How is this treated? This condition may be treated with: Medicine. Antidepressant medicine is usually prescribed for long-term daily control.  Anti-anxiety medicines may be added in severe cases, especially when panic attacks occur. Talk therapy (psychotherapy). Certain types of talk therapy can be helpful in treating GAD by providing support, education, and guidance. Options include: Cognitive behavioral therapy (CBT). People learn coping skills and self-calming techniques to ease their physical symptoms. They learn to identify unrealistic thoughts and behaviors and to replace them with more appropriate thoughts and behaviors. Acceptance and commitment therapy (ACT). This treatment teaches people how to be mindful as a way to cope with unwanted thoughts and feelings. Biofeedback. This process trains you to manage your body's response (physiological response) through breathing techniques and relaxation methods. You will work with a therapist while machines are used to monitor your physical symptoms. Stress management techniques. These include yoga, meditation, and exercise. A mental health specialist can help determine which treatment is best for you. Some people see improvement with one type of therapy. However, other people require a combination of therapies. Follow these instructions at home: Lifestyle Maintain a consistent routine and schedule. Anticipate stressful situations. Create a plan and allow extra time to work with your plan. Practice stress management or self-calming techniques that you have learned from your therapist or your health care provider. Exercise regularly and spend time outdoors. Eat a healthy diet that includes plenty of vegetables, fruits, whole grains, low-fat dairy products, and lean protein. Do not eat a lot of foods that are high in fat, added sugar, or salt (sodium). Drink plenty of water. Avoid alcohol. Alcohol can increase anxiety. Avoid caffeine and certain over-the-counter cold medicines. These may make you feel worse. Ask your pharmacist which medicines to avoid. General instructions Take  over-the-counter and prescription medicines only as told by your health care provider. Understand that you are likely to have setbacks. Accept this and be kind to yourself as you persist to take better care of yourself. Anticipate stressful situations. Create a plan and allow extra time to work with your plan. Recognize and accept your accomplishments, even if you judge  them as small. Spend time with people who care about you. Keep all follow-up visits. This is important. Where to find more information [employer] of Mental Health: http://www.maynard.net/ Substance Abuse and Mental Health Services: SkateOasis.com.pt Contact a health care provider if: Your symptoms do not get better. Your symptoms get worse. You have signs of depression, such as: A persistently sad or irritable mood. Loss of enjoyment in activities that used to bring you joy. Change in weight or eating. Changes in sleeping habits. Get help right away if: You have thoughts about hurting yourself or others. If you ever feel like you may hurt yourself or others, or have thoughts about taking your own life, get help right away. Go to your nearest emergency department or: Call your local emergency services (911 in the [place] Call a suicide crisis helpline, such as the National Suicide Prevention Lifeline at 305 728 6377 or 988 in the [place] This is open 24 hours a day in the [place] Text the Crisis Text Line at 9856874804 (in the [place] Summary Generalized anxiety disorder (GAD) is a mental health condition that involves worry that is not triggered by a specific event. People with GAD often worry excessively about many things in their lives, such as their health and family. GAD may cause symptoms such as restlessness, trouble concentrating, sleep problems, frequent sweating, nausea, diarrhea, headaches, and trembling or muscle twitching. A mental health specialist can help determine which treatment is best for you. Some people see  improvement with one type of therapy. However, other people require a combination of therapies. This information is not intended to replace advice given to you by your health care provider. Make sure you discuss any questions you have with your health care provider. Document Revised: 07/12/2021 Document Reviewed: 04/09/2021 Elsevier Patient Education  2024 Elsevier Inc.     Edwina Barth, MD [facility]

## 2023-08-26 NOTE — Assessment & Plan Note (Signed)
Trazodone not working.  Recommend to stop it. High stress in her life. Sleep hygiene discussed

## 2023-08-26 NOTE — Patient Instructions (Signed)

## 2023-08-26 NOTE — Assessment & Plan Note (Signed)
Chronic and affecting quality of life Recommend to start Zoloft 25 mg daily Psychiatric referral placed today

## 2023-08-26 NOTE — Assessment & Plan Note (Signed)
Stable and well-controlled.  Sees GI doctor on a regular basis Recommend upper endoscopy also for dysphagia

## 2023-08-26 NOTE — Assessment & Plan Note (Signed)
Chronic and affecting quality of life. Recommend orthopedic evaluation Pain management discussed Referral placed today

## 2023-08-26 NOTE — Assessment & Plan Note (Signed)
Elevated blood pressure readings at home Recommend to increase amlodipine to 5 mg daily New prescription sent to pharmacy of record today Blood work done today.

## 2023-08-28 ENCOUNTER — Telehealth: Payer: Self-pay | Admitting: Emergency Medicine

## 2023-08-28 DIAGNOSIS — M545 Low back pain, unspecified: Secondary | ICD-10-CM

## 2023-08-28 NOTE — Telephone Encounter (Signed)
Patient was referred to Dr. Carola Frost for orthopedic. She wanted to know if the referral can be changed to Sports Medicine downstairs. Best callback is [phone].

## 2023-08-28 NOTE — Telephone Encounter (Signed)
Okay to change referral to sports medicine downstairs.

## 2023-08-28 NOTE — Telephone Encounter (Signed)
New referral placed to sports medicine.

## 2023-09-04 DIAGNOSIS — M461 Sacroiliitis, not elsewhere classified: Secondary | ICD-10-CM | POA: Diagnosis not present

## 2023-09-09 ENCOUNTER — Ambulatory Visit: Payer: BC Managed Care – PPO | Admitting: Emergency Medicine

## 2023-09-10 DIAGNOSIS — F411 Generalized anxiety disorder: Secondary | ICD-10-CM | POA: Diagnosis not present

## 2023-09-25 ENCOUNTER — Encounter: Payer: Self-pay | Admitting: Gastroenterology

## 2023-09-26 ENCOUNTER — Other Ambulatory Visit: Payer: Self-pay

## 2023-09-26 MED ORDER — ONDANSETRON HCL 4 MG PO TABS: 4.0000 mg | ORAL_TABLET | ORAL | 3 refills | Status: DC | PRN

## 2023-09-26 MED ORDER — HYOSCYAMINE SULFATE 0.125 MG SL SUBL: 0.1250 mg | SUBLINGUAL_TABLET | Freq: Four times a day (QID) | SUBLINGUAL | 3 refills | Status: DC | PRN

## 2023-09-26 MED ORDER — DICYCLOMINE HCL 20 MG PO TABS: 20.0000 mg | ORAL_TABLET | Freq: Four times a day (QID) | ORAL | 3 refills | Status: AC

## 2023-10-09 ENCOUNTER — Other Ambulatory Visit: Payer: Self-pay | Admitting: Orthopedic Surgery

## 2023-10-09 DIAGNOSIS — M461 Sacroiliitis, not elsewhere classified: Secondary | ICD-10-CM

## 2023-10-14 ENCOUNTER — Ambulatory Visit
Admission: RE | Admit: 2023-10-14 | Discharge: 2023-10-14 | Disposition: A | Discharge status: Home / Self Care | Payer: BC Managed Care – PPO | Source: Ambulatory Visit | Attending: Orthopedic Surgery | Admitting: Orthopedic Surgery

## 2023-10-14 DIAGNOSIS — M545 Low back pain, unspecified: Secondary | ICD-10-CM | POA: Diagnosis not present

## 2023-10-14 DIAGNOSIS — M461 Sacroiliitis, not elsewhere classified: Secondary | ICD-10-CM

## 2023-11-22 DIAGNOSIS — M2569 Stiffness of other specified joint, not elsewhere classified: Secondary | ICD-10-CM | POA: Diagnosis not present

## 2023-11-22 DIAGNOSIS — M6281 Muscle weakness (generalized): Secondary | ICD-10-CM | POA: Diagnosis not present

## 2023-11-22 DIAGNOSIS — M5451 Vertebrogenic low back pain: Secondary | ICD-10-CM | POA: Diagnosis not present

## 2023-11-22 DIAGNOSIS — R293 Abnormal posture: Secondary | ICD-10-CM | POA: Diagnosis not present

## 2023-11-26 DIAGNOSIS — M2569 Stiffness of other specified joint, not elsewhere classified: Secondary | ICD-10-CM | POA: Diagnosis not present

## 2023-11-26 DIAGNOSIS — M6281 Muscle weakness (generalized): Secondary | ICD-10-CM | POA: Diagnosis not present

## 2023-11-26 DIAGNOSIS — R293 Abnormal posture: Secondary | ICD-10-CM | POA: Diagnosis not present

## 2023-11-26 DIAGNOSIS — M5451 Vertebrogenic low back pain: Secondary | ICD-10-CM | POA: Diagnosis not present

## 2023-12-04 DIAGNOSIS — M2569 Stiffness of other specified joint, not elsewhere classified: Secondary | ICD-10-CM | POA: Diagnosis not present

## 2023-12-04 DIAGNOSIS — M6281 Muscle weakness (generalized): Secondary | ICD-10-CM | POA: Diagnosis not present

## 2023-12-04 DIAGNOSIS — M5451 Vertebrogenic low back pain: Secondary | ICD-10-CM | POA: Diagnosis not present

## 2023-12-04 DIAGNOSIS — R293 Abnormal posture: Secondary | ICD-10-CM | POA: Diagnosis not present

## 2023-12-10 DIAGNOSIS — R293 Abnormal posture: Secondary | ICD-10-CM | POA: Diagnosis not present

## 2023-12-10 DIAGNOSIS — M5451 Vertebrogenic low back pain: Secondary | ICD-10-CM | POA: Diagnosis not present

## 2023-12-10 DIAGNOSIS — M2569 Stiffness of other specified joint, not elsewhere classified: Secondary | ICD-10-CM | POA: Diagnosis not present

## 2023-12-10 DIAGNOSIS — M6281 Muscle weakness (generalized): Secondary | ICD-10-CM | POA: Diagnosis not present

## 2023-12-12 DIAGNOSIS — M5451 Vertebrogenic low back pain: Secondary | ICD-10-CM | POA: Diagnosis not present

## 2023-12-12 DIAGNOSIS — R293 Abnormal posture: Secondary | ICD-10-CM | POA: Diagnosis not present

## 2023-12-12 DIAGNOSIS — M6281 Muscle weakness (generalized): Secondary | ICD-10-CM | POA: Diagnosis not present

## 2023-12-12 DIAGNOSIS — M2569 Stiffness of other specified joint, not elsewhere classified: Secondary | ICD-10-CM | POA: Diagnosis not present

## 2023-12-17 DIAGNOSIS — R293 Abnormal posture: Secondary | ICD-10-CM | POA: Diagnosis not present

## 2023-12-17 DIAGNOSIS — M6281 Muscle weakness (generalized): Secondary | ICD-10-CM | POA: Diagnosis not present

## 2023-12-17 DIAGNOSIS — M5451 Vertebrogenic low back pain: Secondary | ICD-10-CM | POA: Diagnosis not present

## 2023-12-17 DIAGNOSIS — M2569 Stiffness of other specified joint, not elsewhere classified: Secondary | ICD-10-CM | POA: Diagnosis not present

## 2023-12-19 DIAGNOSIS — M2569 Stiffness of other specified joint, not elsewhere classified: Secondary | ICD-10-CM | POA: Diagnosis not present

## 2023-12-19 DIAGNOSIS — M6281 Muscle weakness (generalized): Secondary | ICD-10-CM | POA: Diagnosis not present

## 2023-12-19 DIAGNOSIS — R293 Abnormal posture: Secondary | ICD-10-CM | POA: Diagnosis not present

## 2023-12-19 DIAGNOSIS — M5451 Vertebrogenic low back pain: Secondary | ICD-10-CM | POA: Diagnosis not present

## 2023-12-27 ENCOUNTER — Other Ambulatory Visit: Payer: Self-pay | Admitting: Emergency Medicine

## 2023-12-27 DIAGNOSIS — F411 Generalized anxiety disorder: Secondary | ICD-10-CM

## 2023-12-31 DIAGNOSIS — M5451 Vertebrogenic low back pain: Secondary | ICD-10-CM | POA: Diagnosis not present

## 2023-12-31 DIAGNOSIS — M2569 Stiffness of other specified joint, not elsewhere classified: Secondary | ICD-10-CM | POA: Diagnosis not present

## 2023-12-31 DIAGNOSIS — R293 Abnormal posture: Secondary | ICD-10-CM | POA: Diagnosis not present

## 2023-12-31 DIAGNOSIS — M6281 Muscle weakness (generalized): Secondary | ICD-10-CM | POA: Diagnosis not present

## 2024-02-03 ENCOUNTER — Encounter: Payer: Self-pay | Admitting: Emergency Medicine

## 2024-02-04 NOTE — Telephone Encounter (Signed)
Okay to order both mammogram and DEXA scans please.  Thanks.

## 2024-02-05 ENCOUNTER — Other Ambulatory Visit: Payer: Self-pay | Admitting: Radiology

## 2024-02-05 ENCOUNTER — Other Ambulatory Visit: Payer: Self-pay | Admitting: Nurse Practitioner

## 2024-02-05 DIAGNOSIS — Z1231 Encounter for screening mammogram for malignant neoplasm of breast: Secondary | ICD-10-CM

## 2024-02-05 DIAGNOSIS — Z13820 Encounter for screening for osteoporosis: Secondary | ICD-10-CM

## 2024-03-24 DIAGNOSIS — Z0000 Encounter for general adult medical examination without abnormal findings: Secondary | ICD-10-CM | POA: Diagnosis not present

## 2024-03-24 DIAGNOSIS — Z01411 Encounter for gynecological examination (general) (routine) with abnormal findings: Secondary | ICD-10-CM | POA: Diagnosis not present

## 2024-03-24 DIAGNOSIS — Z124 Encounter for screening for malignant neoplasm of cervix: Secondary | ICD-10-CM | POA: Diagnosis not present

## 2024-03-25 DIAGNOSIS — F411 Generalized anxiety disorder: Secondary | ICD-10-CM | POA: Diagnosis not present

## 2024-03-30 LAB — HM PAP SMEAR

## 2024-04-02 ENCOUNTER — Ambulatory Visit
Admission: RE | Admit: 2024-04-02 | Discharge: 2024-04-02 | Disposition: A | Discharge status: Home / Self Care | Payer: BC Managed Care – PPO | Source: Ambulatory Visit | Attending: Emergency Medicine | Admitting: Emergency Medicine

## 2024-04-02 DIAGNOSIS — Z1231 Encounter for screening mammogram for malignant neoplasm of breast: Secondary | ICD-10-CM

## 2024-04-06 ENCOUNTER — Ambulatory Visit (INDEPENDENT_AMBULATORY_CARE_PROVIDER_SITE_OTHER)

## 2024-04-06 ENCOUNTER — Encounter: Payer: Self-pay | Admitting: Emergency Medicine

## 2024-04-06 ENCOUNTER — Other Ambulatory Visit: Payer: Self-pay

## 2024-04-06 ENCOUNTER — Ambulatory Visit: Admitting: Family Medicine

## 2024-04-06 VITALS — BP 172/100 | HR 76 | Ht 64.0 in | Wt 199.0 lb

## 2024-04-06 DIAGNOSIS — M545 Low back pain, unspecified: Secondary | ICD-10-CM

## 2024-04-06 DIAGNOSIS — G8929 Other chronic pain: Secondary | ICD-10-CM

## 2024-04-06 DIAGNOSIS — M4807 Spinal stenosis, lumbosacral region: Secondary | ICD-10-CM | POA: Diagnosis not present

## 2024-04-06 DIAGNOSIS — M419 Scoliosis, unspecified: Secondary | ICD-10-CM | POA: Diagnosis not present

## 2024-04-06 DIAGNOSIS — M5136 Other intervertebral disc degeneration, lumbar region with discogenic back pain only: Secondary | ICD-10-CM | POA: Diagnosis not present

## 2024-04-06 NOTE — Patient Instructions (Addendum)
 Thank you for coming in today.   You received an injection today. Seek immediate medical attention if the joint becomes red, extremely painful, or is oozing fluid.   Please get an Xray today before you leave   Let me know how it's going.

## 2024-04-06 NOTE — Progress Notes (Signed)
 Rubin Payor, PhD, LAT, ATC acting as a scribe for Faith Graham, MD.  Faith Stevens is a 63 y.o. female who presents to [facility] today for LBP ongoing since June 2024. Pain is just lingering although minimal, rating her pain level 2/10. Pt locates pain to L-side of the low back around the SI joint and sometime will go across both sides.  She has had an extensive amount of physical therapy.  Radiating pain: no LE numbness/tingling: no LE weakness: no Aggravates: worse 1st thing in the morning, Treatments tried: prior PT, stretches, dry needling, naproxen,   Dx testing: 03/23/22 DEXA scan  Pertinent review of systems: No fevers or chills  Relevant historical information: Right tibial fracture requiring surgery ORIF.  Subsequent removal of hardware.   Exam:  BP (!) 172/100   Pulse 76   Ht 5\' 4"  (1.626 m)   Wt 199 lb (90.3 kg)   SpO2 100%   BMI 34.16 kg/m  General: Well Developed, well nourished, and in no acute distress.   MSK: L-spine: Normal appearing Tender palpation left SI joint region. Decreased lumbar motion. Lower extremity strength is intact.    Lab and Radiology Results  Procedure: Real-time Ultrasound Guided Injection of the left SI joint Device: Philips Affiniti 50G/GE Logiq Images permanently stored and available for review in PACS Verbal informed consent obtained.  Discussed risks and benefits of procedure. Warned about infection, bleeding, hyperglycemia damage to structures among others. Patient expresses understanding and agreement Time-out conducted.   Noted no overlying erythema, induration, or other signs of local infection.   Skin prepped in a sterile fashion.   Local anesthesia: Topical Ethyl chloride.   With sterile technique and under real time ultrasound guidance: 40 mg of Kenalog and 2 mL of Marcaine injected into SI joint. Fluid seen entering the joint capsule.   Completed without difficulty   Pain  moderately resolved suggesting accurate placement of the medication.   Advised to call if fevers/chills, erythema, induration, drainage, or persistent bleeding.   Images permanently stored and available for review in the ultrasound unit.  Impression: Technically successful ultrasound guided injection.    X-ray images lumbar spine obtained today personally and independently interpreted. 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 Await formal radiology review.  CT scan pelvis from October 2024 personally and independently interpreted. October 2024 personally and independently interpreted. Normal appearance of the hip and pelvis with no abnormality of the left SI joint.  Patient does have facet DJD seen in the lower portion of lumbar spine visible on the CT scan.      Assessment and Plan: 63 y.o. female with chronic left low back pain.  Pain is focused around the SI joint.  She has had a great trial of physical therapy already with lots of documentation with benchmark.  This unfortunately not been successful.  Plan for diagnostic and therapeutic left SI injection today.  If not significantly better would recommend lumbar spine MRI to look for facet arthritis as a possible target for injection in the future.  She will let me know how she feels.   PDMP not reviewed this encounter. Orders Placed This Encounter  Procedures   [place] LIMITED JOINT SPACE STRUCTURES LOW LEFT(NO LINKED CHARGES)    Reason for Exam (SYMPTOM  OR DIAGNOSIS REQUIRED):   left SI joint pain    Preferred imaging location?:   [facility] Lumbar Spine 2-3 Views    Standing  Status:   Future    Number of Occurrences:   1    Expiration Date:   05/06/2024    Reason for Exam (SYMPTOM  OR DIAGNOSIS REQUIRED):   low back pain    Preferred imaging location?:   [facility]    Is patient pregnant?:   No   No orders of the defined types were placed in this encounter.    Discussed warning signs  or symptoms. Please see discharge instructions. Patient expresses understanding.   The above documentation has been reviewed and is accurate and complete Faith Stevens, M.D.

## 2024-04-07 ENCOUNTER — Encounter: Payer: Self-pay | Admitting: Family Medicine

## 2024-04-08 NOTE — Telephone Encounter (Signed)
 Forwarding to Dr. Denyse Amass to review and advise.

## 2024-04-10 ENCOUNTER — Other Ambulatory Visit: Payer: Self-pay | Admitting: Radiology

## 2024-04-10 DIAGNOSIS — Z13820 Encounter for screening for osteoporosis: Secondary | ICD-10-CM

## 2024-04-14 ENCOUNTER — Other Ambulatory Visit: Payer: Self-pay | Admitting: Emergency Medicine

## 2024-04-14 DIAGNOSIS — F411 Generalized anxiety disorder: Secondary | ICD-10-CM

## 2024-04-16 ENCOUNTER — Other Ambulatory Visit: Payer: Self-pay | Admitting: Emergency Medicine

## 2024-04-16 DIAGNOSIS — I10 Essential (primary) hypertension: Secondary | ICD-10-CM

## 2024-04-16 MED ORDER — OLMESARTAN MEDOXOMIL 20 MG PO TABS: 20.0000 mg | ORAL_TABLET | Freq: Every day | ORAL | 0 refills | Status: DC

## 2024-04-16 NOTE — Telephone Encounter (Signed)
 Copied from CRM [id number]. Topic: Clinical - Medication Refill >> Apr 16, 2024 12:54 PM Orien Bird wrote: Most Recent Primary Care Visit:  Provider: Elvira Hammersmith  Department: [facility]  Visit Type: OFFICE VISIT  Date: 08/26/2023  Medication: olmesartan (BENICAR) 20 MG tablet  Has the patient contacted their pharmacy? Yes (Agent: If no, request that the patient contact the pharmacy for the refill. If patient does not wish to contact the pharmacy document the reason why and proceed with request.) (Agent: If yes, when and what did the pharmacy advise?)  Is this the correct pharmacy for this prescription? Yes If no, delete pharmacy and type the correct one.  This is the patient's preferred pharmacy:  [id number] [place], [place] - [address] AT [facility] [address] [facility] [address] [facility] [address] [place] [address] Phone: [phone] Fax: [phone]   Has the prescription been filled recently? No  Is the patient out of the medication? Yes  Has the patient been seen for an appointment in the last year OR does the patient have an upcoming appointment? Yes  Can we respond through MyChart? Yes  Agent: Please be advised that Rx refills may take up to 3 business days. We ask that you follow-up with your pharmacy.

## 2024-04-20 NOTE — Progress Notes (Signed)
 Low back x-ray shows mild arthritis changes.

## 2024-05-11 ENCOUNTER — Other Ambulatory Visit: Payer: Self-pay

## 2024-05-11 ENCOUNTER — Ambulatory Visit: Admitting: Family Medicine

## 2024-05-11 VITALS — BP 154/88 | HR 80 | Ht 64.0 in | Wt 193.0 lb

## 2024-05-11 DIAGNOSIS — F40240 Claustrophobia: Secondary | ICD-10-CM | POA: Diagnosis not present

## 2024-05-11 DIAGNOSIS — M545 Low back pain, unspecified: Secondary | ICD-10-CM | POA: Diagnosis not present

## 2024-05-11 DIAGNOSIS — G8929 Other chronic pain: Secondary | ICD-10-CM

## 2024-05-11 DIAGNOSIS — M8588 Other specified disorders of bone density and structure, other site: Secondary | ICD-10-CM

## 2024-05-11 MED ORDER — ALPRAZOLAM 1 MG PO TABS: ORAL_TABLET | ORAL | 0 refills | Status: DC

## 2024-05-11 NOTE — Patient Instructions (Addendum)
 Thank you for coming in today.   You can schedule your DEXA scan up front at the Check Out desk before you leave today.  You received an injection today. Seek immediate medical attention if the joint becomes red, extremely painful, or is oozing fluid.   You should hear from MRI scheduling within 1 week. If you do not hear please let me know.    I've sent a prescription for alprazolam  to your pharmacy.   Check back after we get the MRI results.

## 2024-05-11 NOTE — Progress Notes (Unsigned)
 Faith Muck, PhD, LAT, ATC acting as a scribe for Faith Juniper, MD.  Faith Stevens is a 63 y.o. female who presents to [facility] at [facility] today for cont'd low back/SI joint pain. Pt was last seen by Dr. Alease Hunter on 04/06/24 and was given a L SI joint steroid injection.   Today, pt reports CSI wore off after about 3-wks. 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 During this time, she was walking 15,000-20,000 steps a day [place] the [place]. Pain is still located to the L side of her low back  Dx imaging: 04/06/24 L-spine XR  Pertinent review of systems: No fevers or chills  Relevant historical information: Hypertension.   Exam:  BP (!) 154/88   Pulse 80   Ht 5\' 4"  (1.626 m)   Wt 193 lb (87.5 kg)   SpO2 100%   BMI 33.13 kg/m  General: Well Developed, well nourished, and in no acute distress.   MSK: L-spine: Normal appearing Nontender palpation midline. Tender palpation left SI joint.  Decreased lumbar motion.    Lab and Radiology Results  Procedure: Real-time Ultrasound Guided Injection of the left SI joint Device: Philips Affiniti 50G/GE Logiq Images permanently stored and available for review in PACS Verbal informed consent obtained.  Discussed risks and benefits of procedure. Warned about infection, bleeding, hyperglycemia damage to structures among others. Patient expresses understanding and agreement Time-out conducted.   Noted no overlying erythema, induration, or other signs of local infection.   Skin prepped in a sterile fashion.   Local anesthesia: Topical Ethyl chloride.   With sterile technique and under real time ultrasound guidance: 40 mg of Kenalog and 2 ml of Marcaine  injected into SI joint. Fluid seen entering the joint capsule.   Completed without difficulty   Pain immediately resolved suggesting accurate placement of the medication.   Advised to call if fevers/chills,  erythema, induration, drainage, or persistent bleeding.   Images permanently stored and available for review in the ultrasound unit.  Impression: Technically successful ultrasound guided injection.        Assessment and Plan: 63 y.o. female with chronic left low back pain.  Pain in the region of the SI joint.  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  Plan for repeat SI joint injection.  This is not a sustainable plan if it only last about 4 weeks.  Additionally proceed to MRI lumbar spine to further characterize source of pain.  She has pretty severe amount of MRI related claustrophobia.  We are going to attempt to use oral anxiolytics Xanax  in this case.  She has been able to get through an MRI that way 1 time in the past.  If this is not sufficient we will consider MRI with IV sedation or CT myelogram.  Additionally patient is overdue for a bone density test.  This was ordered but is not scheduled until September.  I think working to be able to get her in at the [place]  location sooner. PDMP reviewed during this encounter. Orders Placed This Encounter  Procedures   US  LIMITED JOINT SPACE STRUCTURES LOW LEFT(NO LINKED CHARGES)    Reason for Exam (SYMPTOM  OR DIAGNOSIS REQUIRED):   left SI joint pain    Preferred imaging location?:   [facility]   MR  Lumbar Spine Wo Contrast    Standing Status:   Future    Expiration Date:   05/11/2025    What is the patient's sedation requirement?:   Anti-anxiety    Does the patient have a pacemaker or implanted devices?:   No    Preferred imaging location?:   [facility] (table limit-550lbs)   DG Bone Density    Standing Status:   Future    Expiration Date:   05/11/2025    Reason for Exam (SYMPTOM  OR DIAGNOSIS REQUIRED):   evaluate bone density    Is the patient pregnant?:   No    Preferred imaging  location?:   [address]   Meds ordered this encounter  Medications   ALPRAZolam  (XANAX ) 1 MG tablet    Sig: 2 mg po 1 hour prior to MRI for anxiety with MRI    Dispense:  2 tablet    Refill:  0     Discussed warning signs or symptoms. Please see discharge instructions. Patient expresses understanding.   The above documentation has been reviewed and is accurate and complete Faith Stevens, M.D.

## 2024-05-12 DIAGNOSIS — F40240 Claustrophobia: Secondary | ICD-10-CM | POA: Insufficient documentation

## 2024-05-19 ENCOUNTER — Ambulatory Visit (INDEPENDENT_AMBULATORY_CARE_PROVIDER_SITE_OTHER)
Admission: RE | Admit: 2024-05-19 | Discharge: 2024-05-19 | Disposition: A | Discharge status: Home / Self Care | Source: Ambulatory Visit | Attending: Family Medicine | Admitting: Family Medicine

## 2024-05-19 DIAGNOSIS — G8929 Other chronic pain: Secondary | ICD-10-CM

## 2024-05-19 DIAGNOSIS — M8588 Other specified disorders of bone density and structure, other site: Secondary | ICD-10-CM

## 2024-05-20 ENCOUNTER — Encounter: Payer: Self-pay | Admitting: Emergency Medicine

## 2024-05-21 ENCOUNTER — Encounter: Payer: Self-pay | Admitting: Family Medicine

## 2024-05-27 ENCOUNTER — Ambulatory Visit: Payer: Self-pay | Admitting: Family Medicine

## 2024-05-27 NOTE — Progress Notes (Signed)
 Bone density test shows low bone density but not osteoporosis levels.  Have you scheduled the MRI yet?

## 2024-06-11 ENCOUNTER — Encounter: Payer: Self-pay | Admitting: Family Medicine

## 2024-06-15 ENCOUNTER — Ambulatory Visit
Admission: RE | Admit: 2024-06-15 | Discharge: 2024-06-15 | Disposition: A | Discharge status: Home / Self Care | Source: Ambulatory Visit | Attending: Family Medicine | Admitting: Family Medicine

## 2024-06-15 DIAGNOSIS — M47816 Spondylosis without myelopathy or radiculopathy, lumbar region: Secondary | ICD-10-CM | POA: Diagnosis not present

## 2024-06-15 DIAGNOSIS — M545 Low back pain, unspecified: Secondary | ICD-10-CM

## 2024-06-24 NOTE — Progress Notes (Signed)
 Lumbar spine MRI shows some arthritis at the base of the spine that could cause back pain you are experiencing.  I can order an injection in this area if you would like.  This would be a facet joint injection.  He would like me to do so please go ahead and let me know and I will put the order in.  Otherwise return to clinic to go over the results of full detail along with the bone density test.

## 2024-06-30 ENCOUNTER — Ambulatory Visit: Admitting: Family Medicine

## 2024-06-30 ENCOUNTER — Encounter: Payer: Self-pay | Admitting: Emergency Medicine

## 2024-06-30 VITALS — BP 128/84 | HR 81

## 2024-06-30 DIAGNOSIS — G8929 Other chronic pain: Secondary | ICD-10-CM

## 2024-06-30 DIAGNOSIS — M545 Low back pain, unspecified: Secondary | ICD-10-CM | POA: Diagnosis not present

## 2024-06-30 DIAGNOSIS — M8588 Other specified disorders of bone density and structure, other site: Secondary | ICD-10-CM | POA: Diagnosis not present

## 2024-06-30 NOTE — Patient Instructions (Addendum)
 Thank you for coming in today.   I can order a facet injection, in the future, if needed  Check back as needed  *Reminder: Dr. Joane will be out of the office starting August 1st, for about 6 weeks

## 2024-06-30 NOTE — Progress Notes (Unsigned)
 LILLETTE Ileana Collet, PhD, LAT, ATC acting as a scribe for Artist Lloyd, MD.  Faith Stevens is a 63 y.o. female who presents to [facility] at [facility] today for f/u LBP and osteoporosis w/ MRI and DEXA scan review. Pt was last seen by Dr. Lloyd on 05/11/24 and was given a L SI joint steroid injection and was prescribed Xanax  to take prior to her L-spine MRI  Today, pt reports she is feeling much better. She thinks the prior SI joint steroid injection was helpful. She has been working on stretching and HEP.   Dx testing: 06/15/24 L-spine MRI 05/19/24 DEXA scan 04/06/24 L-spine XR   Pertinent review of systems: No fevers or chills  Relevant historical information: Hypertension   Exam:  BP 128/84   Pulse 81   SpO2 96%  General: Well Developed, well nourished, and in no acute distress.   MSK: L-spine: Normal appearing Nontender palpation spinal midline. Decreased lumbar motion.    Lab and Radiology Results  EXAM DESCRIPTION: MR LUMBAR SPINE WO CONTRAST   CLINICAL HISTORY: Lumbar radiculopathy, symptoms persist with > 6 wks treatment   COMPARISON: None Available.   TECHNIQUE: MRI of the lumbar spine is performed according to our usual protocol with axial and sagittal multi sequence imaging.   FINDINGS: The alignment is unremarkable. Mild degenerative disc disease L3-4 through L5-S1. No discogenic endplate marrow edema. Moderate facet arthritis L4-5 on the left with mild to moderate degenerative edema. The marrow signal is otherwise unremarkable as well as the conus and cauda equina.   L4-5 has mild foraminal and lateral recess on the left due to broad-based disc bulge and facet hypertrophy.   The image levels are otherwise unremarkable.   The imaged retroperitoneal structures are unremarkable.     IMPRESSION: No disc protrusion or evidence of impingement.   L4-5 has mild degenerative foraminal and lateral recess narrowing on the left.   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   Electronically signed by: Reyes Frees MD 06/24/2024 05:07 AM EDT RP Workstation: [id number]   Date of study: 05/19/2024 Exam: DUAL X-RAY ABSORPTIOMETRY (DXA) FOR BONE MINERAL DENSITY (BMD) Instrument: [employer] Therapist, art Provider: PCP Indication: follow up for low BMD Comparison: none (please note that it is not possible to compare data from different instruments) Clinical data: Pt is a 62 y.o. female without previous history of fracture.   Results:   Lumbar spine L1-L4 Femoral neck (FN)  T-score -1.5 RFN: -1.6 LFN: -1.3      Assessment: By the [facility] Criteria for diagnosis based on bone density, this patient has Low Bone Density      FRAX 10-year fracture risk calculator: 4.4 % for any major fracture and 0.4 % for hip fracture. Pharmacologic therapy is recommended if 10 year fracture risk is >20% for any major osteoporotic fracture or >3% for hip fracture.     Assessment and Plan: 63 y.o. female with chronic low back pain.  Pain is multifactorial.  She does have moderate left-sided facet osteoarthritis with bone marrow edema at left L4-5.  This correlates to the location of her pain.  She has had benefit from in clinic SI joint injection indicating that the SI joint is also a pain generator.  Next step if needed would be a standard facet steroid injection targeting the left L4-5 facet joint.  Happy to order that in the future if needed.  Additionally we could refer to pain management if she needs  to proceed to medial branch block and ablation although with the facet edema I think a steroid injection would work quite well.  Additionally we talked about her bone density test.  She has had some bone density tests previously in [place] that did show a T-score worse than -2.5.  Today her lumbar spine T-score is -1.5.  Previously this was -2.8.  The hips are similar at -1.6 and -1.3.  She currently  is in the osteopenia range.  Plan for watchful waiting weightbearing exercise and vitamin D .  Recheck bone density in 2 years.   PDMP not reviewed this encounter. No orders of the defined types were placed in this encounter.  No orders of the defined types were placed in this encounter.    Discussed warning signs or symptoms. Please see discharge instructions. Patient expresses understanding.   The above documentation has been reviewed and is accurate and complete Artist Lloyd, M.D. Total encounter time 30 minutes including face-to-face time with the patient and, reviewing past medical record, and charting on the date of service.

## 2024-07-01 DIAGNOSIS — M8588 Other specified disorders of bone density and structure, other site: Secondary | ICD-10-CM | POA: Insufficient documentation

## 2024-08-27 ENCOUNTER — Ambulatory Visit (INDEPENDENT_AMBULATORY_CARE_PROVIDER_SITE_OTHER): Admitting: Emergency Medicine

## 2024-08-27 ENCOUNTER — Encounter: Payer: Self-pay | Admitting: Emergency Medicine

## 2024-08-27 ENCOUNTER — Ambulatory Visit (INDEPENDENT_AMBULATORY_CARE_PROVIDER_SITE_OTHER)

## 2024-08-27 VITALS — BP 128/84 | HR 86 | Temp 98.1°F | Ht 64.0 in | Wt 200.0 lb

## 2024-08-27 DIAGNOSIS — Z0000 Encounter for general adult medical examination without abnormal findings: Secondary | ICD-10-CM

## 2024-08-27 DIAGNOSIS — Z13228 Encounter for screening for other metabolic disorders: Secondary | ICD-10-CM

## 2024-08-27 DIAGNOSIS — Z130 Encounter for screening for diseases of the blood and blood-forming organs and certain disorders involving the immune mechanism: Secondary | ICD-10-CM

## 2024-08-27 DIAGNOSIS — I10 Essential (primary) hypertension: Secondary | ICD-10-CM

## 2024-08-27 DIAGNOSIS — Z13220 Encounter for screening for lipoid disorders: Secondary | ICD-10-CM

## 2024-08-27 DIAGNOSIS — Z0001 Encounter for general adult medical examination with abnormal findings: Secondary | ICD-10-CM

## 2024-08-27 DIAGNOSIS — Z1329 Encounter for screening for other suspected endocrine disorder: Secondary | ICD-10-CM | POA: Diagnosis not present

## 2024-08-27 DIAGNOSIS — R059 Cough, unspecified: Secondary | ICD-10-CM | POA: Diagnosis not present

## 2024-08-27 DIAGNOSIS — F411 Generalized anxiety disorder: Secondary | ICD-10-CM

## 2024-08-27 DIAGNOSIS — L659 Nonscarring hair loss, unspecified: Secondary | ICD-10-CM

## 2024-08-27 DIAGNOSIS — F419 Anxiety disorder, unspecified: Secondary | ICD-10-CM

## 2024-08-27 DIAGNOSIS — Z8719 Personal history of other diseases of the digestive system: Secondary | ICD-10-CM

## 2024-08-27 LAB — COMPREHENSIVE METABOLIC PANEL WITH GFR
ALT: 19 U/L (ref 0–35)
AST: 21 U/L (ref 0–37)
Albumin: 4.8 g/dL (ref 3.5–5.2)
Alkaline Phosphatase: 93 U/L (ref 39–117)
BUN: 19 mg/dL (ref 6–23)
CO2: 30 meq/L (ref 19–32)
Calcium: 9.4 mg/dL (ref 8.4–10.5)
Chloride: 101 meq/L (ref 96–112)
Creatinine, Ser: 0.67 mg/dL (ref 0.40–1.20)
GFR: 94.69 mL/min (ref >60.00)
Glucose, Bld: 101 mg/dL — ABNORMAL HIGH (ref 70–99)
Potassium: 4.1 meq/L (ref 3.5–5.1)
Sodium: 139 meq/L (ref 135–145)
Total Bilirubin: 0.3 mg/dL (ref 0.2–1.2)
Total Protein: 7.7 g/dL (ref 6.0–8.3)

## 2024-08-27 LAB — CBC WITH DIFFERENTIAL/PLATELET
Basophils Absolute: 0 K/uL (ref 0.0–0.1)
Basophils Relative: 1 % (ref 0.0–3.0)
Eosinophils Absolute: 0 K/uL (ref 0.0–0.7)
Eosinophils Relative: 0.9 % (ref 0.0–5.0)
HCT: 40.3 % (ref 36.0–46.0)
Hemoglobin: 13.4 g/dL (ref 12.0–15.0)
Lymphocytes Relative: 35.1 % (ref 12.0–46.0)
Lymphs Abs: 1.8 K/uL (ref 0.7–4.0)
MCHC: 33.2 g/dL (ref 30.0–36.0)
MCV: 90.3 fl (ref 78.0–100.0)
Monocytes Absolute: 0.3 K/uL (ref 0.1–1.0)
Monocytes Relative: 6.9 % (ref 3.0–12.0)
Neutro Abs: 2.8 K/uL (ref 1.4–7.7)
Neutrophils Relative %: 56.1 % (ref 43.0–77.0)
Platelets: 260 K/uL (ref 150.0–400.0)
RBC: 4.47 Mil/uL (ref 3.87–5.11)
RDW: 13.2 % (ref 11.5–15.5)
WBC: 5 K/uL (ref 4.0–10.5)

## 2024-08-27 LAB — URINALYSIS
Bilirubin Urine: NEGATIVE
Hgb urine dipstick: NEGATIVE
Ketones, ur: NEGATIVE
Leukocytes,Ua: NEGATIVE
Nitrite: NEGATIVE
Specific Gravity, Urine: 1.025 (ref 1.000–1.030)
Total Protein, Urine: NEGATIVE
Urine Glucose: NEGATIVE
Urobilinogen, UA: 0.2 (ref 0.0–1.0)
pH: 6 (ref 5.0–8.0)

## 2024-08-27 LAB — LIPID PANEL
Cholesterol: 174 mg/dL (ref 0–200)
HDL: 67.1 mg/dL (ref >39.00)
LDL Cholesterol: 92 mg/dL (ref 0–99)
NonHDL: 106.51
Total CHOL/HDL Ratio: 3
Triglycerides: 74 mg/dL (ref 0.0–149.0)
VLDL: 14.8 mg/dL (ref 0.0–40.0)

## 2024-08-27 LAB — VITAMIN B12: Vitamin B-12: 427 pg/mL (ref 211–911)

## 2024-08-27 LAB — VITAMIN D 25 HYDROXY (VIT D DEFICIENCY, FRACTURES): VITD: 53.07 ng/mL (ref 30.00–100.00)

## 2024-08-27 LAB — TSH: TSH: 1.36 u[IU]/mL (ref 0.35–5.50)

## 2024-08-27 LAB — CORTISOL: Cortisol, Plasma: 3.4 ug/dL

## 2024-08-27 LAB — HEMOGLOBIN A1C: Hgb A1c MFr Bld: 5.9 % (ref 4.6–6.5)

## 2024-08-27 MED ORDER — AMLODIPINE BESYLATE 10 MG PO TABS
10.0000 mg | ORAL_TABLET | Freq: Every day | ORAL | 3 refills | Status: DC
Start: 2024-08-27 — End: 2024-11-19

## 2024-08-27 MED ORDER — SERTRALINE HCL 25 MG PO TABS: 25.0000 mg | ORAL_TABLET | Freq: Every day | ORAL | 3 refills | Status: AC

## 2024-08-27 MED ORDER — OLMESARTAN MEDOXOMIL 40 MG PO TABS: 40.0000 mg | ORAL_TABLET | Freq: Every day | ORAL | 3 refills | Status: AC

## 2024-08-27 NOTE — Assessment & Plan Note (Signed)
 Stable and well-controlled. Sees GI doctor on a regular basis

## 2024-08-27 NOTE — Patient Instructions (Signed)

## 2024-08-27 NOTE — Assessment & Plan Note (Signed)
 BP Readings from Last 3 Encounters:  08/27/24 128/84  06/30/24 128/84  05/11/24 (!) 154/88  Elevated blood pressure readings at home Has been taking amlodipine  10 mg.  Will continue Increase olmesartan  to 40 mg daily Cardiovascular risks associated with uncontrolled hypertension discussed Dietary approaches to stop hypertension discussed Benefits of exercise discussed

## 2024-08-27 NOTE — Assessment & Plan Note (Signed)
 Unknown cause Recommend blood work today Dermatology referral placed

## 2024-08-27 NOTE — Progress Notes (Signed)
 Faith Stevens 63 y.o.   Chief Complaint  Patient presents with   Annual Exam    Patient here for physical she also Want blood work, vitamin levels and UA, discuss switching to 90 day prescription for the Sertraline . would like to get a chest x-ray due to a different type of cough. Patient mentions bp has been high, wanted to discuss medication     HISTORY OF PRESENT ILLNESS: This is a 63 y.o. female here for annual exam History of hypertension.  Elevated readings at home. Chronic cough.  Requesting chest x-ray No other complaints or medical concerns today.  HPI   Prior to Admission medications   Medication Sig Start Date End Date Taking? Authorizing Provider  ALPRAZolam  (XANAX ) 1 MG tablet Take 0.5 mg by mouth daily as needed for anxiety.   Yes [provider]  amLODipine  (NORVASC ) 10 MG tablet Take 1 tablet (10 mg total) by mouth daily. 08/27/24  Yes Belisa Eichholz, Emil Schanz, MD  B Complex-C (B-COMPLEX WITH VITAMIN C) tablet Take 1 tablet by mouth daily.   Yes [provider]  Cholecalciferol  (VITAMIN D3 PO) Take 1 tablet by mouth daily.   Yes [provider]  diclofenac (CATAFLAM) 50 MG tablet Take 50 mg by mouth 2 (two) times daily. 10/29/23  Yes [provider]  diclofenac Sodium (VOLTAREN) 1 % GEL Apply 1 Application topically 2 (two) times daily as needed (joint pain).   Yes [provider]  dicyclomine  (BENTYL ) 20 MG tablet Take 1 tablet (20 mg total) by mouth every 6 (six) hours. 09/26/23  Yes Stacia Glendia BRAVO, MD  hyoscyamine  (LEVSIN  SL) 0.125 MG SL tablet Place 1 tablet (0.125 mg total) under the tongue every 6 (six) hours as needed. 09/26/23  Yes Stacia Glendia BRAVO, MD  olmesartan  (BENICAR ) 40 MG tablet Take 1 tablet (40 mg total) by mouth daily. 08/27/24  Yes Carlota Philley, Emil Schanz, MD  ondansetron  (ZOFRAN ) 4 MG tablet Take 1 tablet (4 mg total) by mouth every 4 (four) hours as needed for nausea or vomiting. 09/26/23  Yes  Stacia Glendia BRAVO, MD  sertraline  (ZOLOFT ) 25 MG tablet Take 1 tablet (25 mg total) by mouth daily. 08/27/24   Purcell Emil Schanz, MD    Allergies  Allergen Reactions   Latex Swelling    Patient Active Problem List   Diagnosis Date Noted   Osteopenia of lumbar spine 07/01/2024   Claustrophobia especially with MRI 05/12/2024   Chronic bilateral low back pain without sciatica 08/26/2023   Generalized anxiety disorder 08/26/2023   Chronic insomnia 03/04/2023   Chronic anxiety 03/04/2023   History of IBS 03/04/2023   Essential hypertension 08/14/2021   Anxiety 08/14/2021    Past Medical History:  Diagnosis Date   Anxiety 08/14/2021   Arthritis    Essential hypertension 08/14/2021   HTN (hypertension) with goal to be determined     Past Surgical History:  Procedure Laterality Date   ABDOMINAL HYSTERECTOMY     Total   CHOLECYSTECTOMY     EXTERNAL FIXATION LEG Right 08/13/2021   Procedure: EXTERNAL FIXATION LEG;  Surgeon: Celena Sharper, MD;  Location: [facility] OR;  Service: Orthopedics;  Laterality: Right;   EXTERNAL FIXATION REMOVAL Right 08/24/2021   Procedure: REMOVAL EXTERNAL FIXATION LEG;  Surgeon: Celena Sharper, MD;  Location: [facility] OR;  Service: Orthopedics;  Laterality: Right;   HARDWARE REMOVAL Right 01/03/2023   Procedure: REMOVAL OF HARDWARE RIGHT KNEE;  Surgeon: Celena Sharper, MD;  Location: [facility] OR;  Service: Orthopedics;  Laterality:  Right;   ORIF TIBIA PLATEAU Right 08/24/2021   Procedure: OPEN REDUCTION INTERNAL FIXATION (ORIF) TIBIAL PLATEAU;  Surgeon: Celena Sharper, MD;  Location: [facility] OR;  Service: Orthopedics;  Laterality: Right;    Social History   Socioeconomic History   Marital status: Married    Spouse name: Not on file   Number of children: 0   Years of education: Not on file   Highest education level: Master's degree (e.g., MA, MS, MEng, MEd, MSW, MBA)  Occupational History   Not on file  Tobacco Use   Smoking status: Never   Smokeless tobacco:  Never  Vaping Use   Vaping status: Never Used  Substance and Sexual Activity   Alcohol use: Never   Drug use: Never   Sexual activity: Not on file  Other Topics Concern   Not on file  Social History Narrative   Not on file   Social Drivers of Health   Financial Resource Strain: Low Risk  (08/23/2024)   Overall Financial Resource Strain (CARDIA)    Difficulty of Paying Living Expenses: Not hard at all  Food Insecurity: No Food Insecurity (08/23/2024)   Hunger Vital Sign    Worried About Running Out of Food in the Last Year: Never true    Ran Out of Food in the Last Year: Never true  Transportation Needs: No Transportation Needs (08/23/2024)   PRAPARE - Administrator, Civil Service (Medical): No    Lack of Transportation (Non-Medical): No  Physical Activity: Insufficiently Active (08/23/2024)   Exercise Vital Sign    Days of Exercise per Week: 3 days    Minutes of Exercise per Session: 20 min  Stress: Stress Concern Present (08/23/2024)   [employer] of Occupational Health - Occupational Stress Questionnaire    Feeling of Stress: To some extent  Social Connections: Unknown (08/23/2024)   Social Connection and Isolation Panel    Frequency of Communication with Friends and Family: Once a week    Frequency of Social Gatherings with Friends and Family: Patient declined    Attends Religious Services: Never    Database administrator or Organizations: No    Attends Engineer, structural: Not on file    Marital Status: Married  Catering manager Violence: Not on file    Family History  Problem Relation Age of Onset   CVA Mother    Colon cancer Neg Hx    Esophageal cancer Neg Hx    Stomach cancer Neg Hx      Review of Systems  Constitutional: Negative.  Negative for chills and fever.  HENT: Negative.  Negative for congestion and sore throat.   Respiratory:  Positive for cough. Negative for shortness of breath.   Cardiovascular: Negative.  Negative for  chest pain and palpitations.  Gastrointestinal:  Negative for abdominal pain, diarrhea, nausea and vomiting.  Genitourinary: Negative.  Negative for dysuria and hematuria.  Skin: Negative.  Negative for rash.  Neurological: Negative.  Negative for dizziness and headaches.  All other systems reviewed and are negative.   Vitals:   08/27/24 1341  BP: 128/84  Pulse: 86  Temp: 98.1 F (36.7 C)  SpO2: 97%    Physical Exam Vitals reviewed.  Constitutional:      Appearance: Normal appearance.  HENT:     Head: Normocephalic.     Right Ear: Tympanic membrane, ear canal and external ear normal.     Left Ear: Tympanic membrane, ear canal and external ear normal.  Mouth/Throat:     Mouth: Mucous membranes are moist.     Pharynx: Oropharynx is clear.  Eyes:     Extraocular Movements: Extraocular movements intact.     Pupils: Pupils are equal, round, and reactive to light.  Cardiovascular:     Rate and Rhythm: Normal rate and regular rhythm.     Pulses: Normal pulses.     Heart sounds: Normal heart sounds.  Pulmonary:     Effort: Pulmonary effort is normal.     Breath sounds: Normal breath sounds.  Abdominal:     Palpations: Abdomen is soft.     Tenderness: There is no abdominal tenderness.  Musculoskeletal:     Cervical back: No tenderness.  Lymphadenopathy:     Cervical: No cervical adenopathy.  Skin:    General: Skin is warm and dry.     Capillary Refill: Capillary refill takes less than 2 seconds.  Neurological:     General: No focal deficit present.     Mental Status: She is alert and oriented to person, place, and time.  Psychiatric:        Mood and Affect: Mood normal.        Behavior: Behavior normal.    DG Chest 2 View Result Date: 08/27/2024 CLINICAL DATA:  Cough. EXAM: CHEST - 2 VIEW COMPARISON:  None Available. FINDINGS: The heart size and mediastinal contours are within normal limits. No focal consolidation, pleural effusion, or pneumothorax. No acute osseous  abnormality. IMPRESSION: No acute cardiopulmonary findings. Electronically Signed   By: Harrietta Sherry M.D.   On: 08/27/2024 14:48     ASSESSMENT & PLAN: Problem List Items Addressed This Visit       Cardiovascular and Mediastinum   Essential hypertension   BP Readings from Last 3 Encounters:  08/27/24 128/84  06/30/24 128/84  05/11/24 (!) 154/88  Elevated blood pressure readings at home Has been taking amlodipine  10 mg.  Will continue Increase olmesartan  to 40 mg daily Cardiovascular risks associated with uncontrolled hypertension discussed Dietary approaches to stop hypertension discussed Benefits of exercise discussed       Relevant Medications   olmesartan  (BENICAR ) 40 MG tablet   amLODipine  (NORVASC ) 10 MG tablet   Other Relevant Orders   Urinalysis (Completed)   CBC with Differential/Platelet (Completed)   Comprehensive metabolic panel with GFR (Completed)   Lipid panel (Completed)   Vitamin B12 (Completed)   VITAMIN D  25 Hydroxy (Vit-D Deficiency, Fractures) (Completed)   TSH (Completed)   Cortisol (Completed)   DG Chest 2 View (Completed)     Other   Chronic anxiety   Chronic but clinically stable Continue sertraline  25 mg daily      Relevant Medications   sertraline  (ZOLOFT ) 25 MG tablet   History of IBS   Stable and well-controlled. Sees GI doctor on a regular basis       Generalized anxiety disorder   Relevant Medications   sertraline  (ZOLOFT ) 25 MG tablet   Hair loss   Unknown cause Recommend blood work today Dermatology referral placed      Relevant Orders   Vitamin B12 (Completed)   VITAMIN D  25 Hydroxy (Vit-D Deficiency, Fractures) (Completed)   TSH (Completed)   Cortisol (Completed)   Ambulatory referral to Dermatology   Other Visit Diagnoses       Encounter for general adult medical examination with abnormal findings    -  Primary   Relevant Orders   Urinalysis (Completed)   CBC with Differential/Platelet (Completed)  Comprehensive metabolic panel with GFR (Completed)   Hemoglobin A1c (Completed)   Lipid panel (Completed)     Screening for deficiency anemia       Relevant Orders   CBC with Differential/Platelet (Completed)     Screening for lipoid disorders       Relevant Orders   Lipid panel (Completed)     Screening for endocrine, metabolic and immunity disorder       Relevant Orders   Urinalysis (Completed)   Comprehensive metabolic panel with GFR (Completed)   Hemoglobin A1c (Completed)   Vitamin B12 (Completed)   VITAMIN D  25 Hydroxy (Vit-D Deficiency, Fractures) (Completed)   TSH (Completed)   Cortisol (Completed)      Modifiable risk factors discussed with patient. Anticipatory guidance according to age provided. The following topics were also discussed: Social Determinants of Health Smoking.  Non-smoker Diet and nutrition Benefits of exercise Cancer screening and review of most recent mammogram and colonoscopy reports Vaccinations review and recommendations Cardiovascular risk assessment The 10-year ASCVD risk score (Arnett DK, et al., 2019) is: 3.5%   Values used to calculate the score:     Age: 59 years     Clincally relevant sex: Female     Is Non-Hispanic African American: No     Diabetic: No     Tobacco smoker: No     Systolic Blood Pressure: 128 mmHg     Is BP treated: Yes     HDL Cholesterol: 67.1 mg/dL     Total Cholesterol: 174 mg/dL Cardiovascular risks associated with hypertension Management of hypertension and review of medications and changes made Mental health including depression and anxiety Fall and accident prevention  Patient Instructions  Health Maintenance, Female Adopting a healthy lifestyle and getting preventive care are important in promoting health and wellness. Ask your health care provider about: The right schedule for you to have regular tests and exams. Things you can do on your own to prevent diseases and keep yourself healthy. What should I  know about diet, weight, and exercise? Eat a healthy diet  Eat a diet that includes plenty of vegetables, fruits, low-fat dairy products, and lean protein. Do not eat a lot of foods that are high in solid fats, added sugars, or sodium. Maintain a healthy weight Body mass index (BMI) is used to identify weight problems. It estimates body fat based on height and weight. Your health care provider can help determine your BMI and help you achieve or maintain a healthy weight. Get regular exercise Get regular exercise. This is one of the most important things you can do for your health. Most adults should: Exercise for at least 150 minutes each week. The exercise should increase your heart rate and make you sweat (moderate-intensity exercise). Do strengthening exercises at least twice a week. This is in addition to the moderate-intensity exercise. Spend less time sitting. Even light physical activity can be beneficial. Watch cholesterol and blood lipids Have your blood tested for lipids and cholesterol at 61 years of age, then have this test every 5 years. Have your cholesterol levels checked more often if: Your lipid or cholesterol levels are high. You are older than 61 years of age. You are at high risk for heart disease. What should I know about cancer screening? Depending on your health history and family history, you may need to have cancer screening at various ages. This may include screening for: Breast cancer. Cervical cancer. Colorectal cancer. Skin cancer. Lung cancer. What should I know about  heart disease, diabetes, and high blood pressure? Blood pressure and heart disease High blood pressure causes heart disease and increases the risk of stroke. This is more likely to develop in people who have high blood pressure readings or are overweight. Have your blood pressure checked: Every 3-5 years if you are 78-43 years of age. Every year if you are 41 years old or  older. Diabetes Have regular diabetes screenings. This checks your fasting blood sugar level. Have the screening done: Once every three years after age 76 if you are at a normal weight and have a low risk for diabetes. More often and at a younger age if you are overweight or have a high risk for diabetes. What should I know about preventing infection? Hepatitis B If you have a higher risk for hepatitis B, you should be screened for this virus. Talk with your health care provider to find out if you are at risk for hepatitis B infection. Hepatitis C Testing is recommended for: Everyone born from [birth year] through 1963. Anyone with known risk factors for hepatitis C. Sexually transmitted infections (STIs) Get screened for STIs, including gonorrhea and chlamydia, if: You are sexually active and are younger than 61 years of age. You are older than 61 years of age and your health care provider tells you that you are at risk for this type of infection. Your sexual activity has changed since you were last screened, and you are at increased risk for chlamydia or gonorrhea. Ask your health care provider if you are at risk. Ask your health care provider about whether you are at high risk for HIV. Your health care provider may recommend a prescription medicine to help prevent HIV infection. If you choose to take medicine to prevent HIV, you should first get tested for HIV. You should then be tested every 3 months for as long as you are taking the medicine. Pregnancy If you are about to stop having your period (premenopausal) and you may become pregnant, seek counseling before you get pregnant. Take 400 to 800 micrograms (mcg) of folic acid every day if you become pregnant. Ask for birth control (contraception) if you want to prevent pregnancy. Osteoporosis and menopause Osteoporosis is a disease in which the bones lose minerals and strength with aging. This can result in bone fractures. If you are 52 years old  or older, or if you are at risk for osteoporosis and fractures, ask your health care provider if you should: Be screened for bone loss. Take a calcium or vitamin D  supplement to lower your risk of fractures. Be given hormone replacement therapy (HRT) to treat symptoms of menopause. Follow these instructions at home: Alcohol use Do not drink alcohol if: Your health care provider tells you not to drink. You are pregnant, may be pregnant, or are planning to become pregnant. If you drink alcohol: Limit how much you have to: 0-1 drink a day. Know how much alcohol is in your drink. In the [place] one drink equals one 12 oz bottle of beer (355 mL), one 5 oz glass of wine (148 mL), or one 1 oz glass of hard liquor (44 mL). Lifestyle Do not use any products that contain nicotine or tobacco. These products include cigarettes, chewing tobacco, and vaping devices, such as e-cigarettes. If you need help quitting, ask your health care provider. Do not use street drugs. Do not share needles. Ask your health care provider for help if you need support or information about quitting drugs. General instructions  Schedule regular health, dental, and eye exams. Stay current with your vaccines. Tell your health care provider if: You often feel depressed. You have ever been abused or do not feel safe at home. Summary Adopting a healthy lifestyle and getting preventive care are important in promoting health and wellness. Follow your health care provider's instructions about healthy diet, exercising, and getting tested or screened for diseases. Follow your health care provider's instructions on monitoring your cholesterol and blood pressure. This information is not intended to replace advice given to you by your health care provider. Make sure you discuss any questions you have with your health care provider. Document Revised: 05/08/2021 Document Reviewed: 05/08/2021 Elsevier Patient Education  2024 Elsevier Inc.          Emil Schaumann, MD [facility]

## 2024-08-27 NOTE — Assessment & Plan Note (Signed)
 Chronic but clinically stable Continue sertraline  25 mg daily

## 2024-08-28 ENCOUNTER — Ambulatory Visit: Payer: Self-pay | Admitting: Emergency Medicine

## 2024-09-02 ENCOUNTER — Encounter: Payer: Self-pay | Admitting: Emergency Medicine

## 2024-09-03 ENCOUNTER — Other Ambulatory Visit: Payer: Self-pay | Admitting: Radiology

## 2024-09-04 NOTE — Addendum Note (Signed)
 Addended by: GALA GALLEY on: 09/04/2024 08:28 AM   Modules accepted: Orders

## 2024-09-09 NOTE — Telephone Encounter (Signed)
 Place referral as requested please.  Recommend to decrease dose of olmesartan  to 30 mg.

## 2024-09-10 ENCOUNTER — Encounter: Payer: Self-pay | Admitting: Emergency Medicine

## 2024-09-10 ENCOUNTER — Other Ambulatory Visit: Payer: Self-pay | Admitting: Radiology

## 2024-09-10 DIAGNOSIS — I10 Essential (primary) hypertension: Secondary | ICD-10-CM

## 2024-09-29 ENCOUNTER — Other Ambulatory Visit: Payer: BC Managed Care – PPO

## 2024-10-10 ENCOUNTER — Other Ambulatory Visit: Payer: Self-pay | Admitting: Emergency Medicine

## 2024-10-10 DIAGNOSIS — I10 Essential (primary) hypertension: Secondary | ICD-10-CM

## 2024-10-12 ENCOUNTER — Other Ambulatory Visit: Payer: Self-pay | Admitting: Emergency Medicine

## 2024-10-12 MED ORDER — ZEPBOUND 2.5 MG/0.5ML ~~LOC~~ SOAJ: 2.5000 mg | SUBCUTANEOUS | 3 refills | Status: DC

## 2024-10-12 NOTE — Telephone Encounter (Signed)
 New prescription for Zepbound sent to pharmacy of record today.

## 2024-10-13 DIAGNOSIS — L658 Other specified nonscarring hair loss: Secondary | ICD-10-CM | POA: Diagnosis not present

## 2024-10-19 ENCOUNTER — Telehealth: Payer: Self-pay

## 2024-10-19 ENCOUNTER — Other Ambulatory Visit (HOSPITAL_COMMUNITY): Payer: Self-pay

## 2024-10-19 NOTE — Telephone Encounter (Signed)
 Pharmacy Patient Advocate Encounter   Received notification from Patient Advice Request messages that prior authorization for Zepbound 2.5mg /0.55ml is required/requested.   Insurance verification completed.   The patient is insured through [facility].   Per test claim: Per test claim, medication is not covered due to plan/benefit exclusion, PA not submitted at this time

## 2024-10-21 ENCOUNTER — Other Ambulatory Visit (HOSPITAL_COMMUNITY): Payer: Self-pay

## 2024-11-15 ENCOUNTER — Encounter (HOSPITAL_BASED_OUTPATIENT_CLINIC_OR_DEPARTMENT_OTHER): Payer: Self-pay

## 2024-11-19 ENCOUNTER — Ambulatory Visit (HOSPITAL_BASED_OUTPATIENT_CLINIC_OR_DEPARTMENT_OTHER): Admitting: Cardiology

## 2024-11-19 ENCOUNTER — Encounter (HOSPITAL_BASED_OUTPATIENT_CLINIC_OR_DEPARTMENT_OTHER): Payer: Self-pay | Admitting: Cardiology

## 2024-11-19 VITALS — BP 124/80 | HR 82 | Ht 64.0 in | Wt 192.0 lb

## 2024-11-19 DIAGNOSIS — Z7189 Other specified counseling: Secondary | ICD-10-CM

## 2024-11-19 DIAGNOSIS — L659 Nonscarring hair loss, unspecified: Secondary | ICD-10-CM | POA: Diagnosis not present

## 2024-11-19 DIAGNOSIS — R002 Palpitations: Secondary | ICD-10-CM

## 2024-11-19 DIAGNOSIS — I10 Essential (primary) hypertension: Secondary | ICD-10-CM

## 2024-11-19 NOTE — Progress Notes (Signed)
 Cardiology Office Note:  .   Date:  11/19/2024  ID:  Faith Stevens, DOB 1961/08/20, MRN [id number] PCP: Pur[facility] Emil Schanz, MD  [facility] Providers Cardiologist:  Shelda Bruckner, MD {  History of Present Illness: .   Faith Stevens is a 63 y.o. female with PMH hypertension who is seen for a new patient consultation at the request of Dr. Sagardia for management of hypertension and cardiovascular risk.   Referral from 09/10/24 reviewed. Extensive notes reviewed under media tab. She was previously followed by a cardiologist in [place]  Has had blood pressure issues since ~2000. Was well controlled until recently, meds changed with Dr. Sagardia. Saw dermatologist, started on minoxidil for hair loss, has had lower blood pressure since then. She feels terrible at typical normal blood pressures. Has seen 100/60, felt like she was going to pass out. Feels bad if BP 120/80. Recent highest blood pressure 160/110, rare, three times in the last several months.   Currently taking amlodipine  10 mg daily, olmesartan  40 mg daily, and recently minoxidil 2.5 mg daily.   ROS positive for numbness/tingling on left side of lips and sometimes left arm tingles as well without weakness, intermittent. Started earlier this year. She has a history of Bell's palsy in 2019 on left side. Mother and grandmother both had strokes.  Rare intermittent palpitations, not related to activity. Brief, happens 2-3 times/year.  ROS: Denies chest pain, shortness of breath at rest or with normal exertion. No PND, orthopnea, LE edema or unexpected weight gain. No syncope. ROS otherwise negative except as noted.   Studies Reviewed: SABRA    EKG:       Physical Exam:   VS:  BP 124/80 (BP Location: Left Arm, Patient Position: Sitting, Cuff Size: Large)   Pulse 82   Ht 5' 4 (1.626 m)   Wt 192 lb (87.1 kg)   SpO2 99%   BMI 32.96 kg/m    Wt Readings from Last 3 Encounters:  11/19/24 192 lb (87.1 kg)   08/27/24 200 lb (90.7 kg)  05/11/24 193 lb (87.5 kg)    GEN: Well nourished, well developed in no acute distress HEENT: Normal, moist mucous membranes NECK: No JVD CARDIAC: regular rhythm, normal S1 and S2, no rubs or gallops. No murmur. VASCULAR: Radial and DP pulses 2+ bilaterally. No carotid bruits RESPIRATORY:  Clear to auscultation without rales, wheezing or rhonchi  ABDOMEN: Soft, non-tender, non-distended MUSCULOSKELETAL:  Ambulates independently SKIN: Warm and dry, no edema NEUROLOGIC:  Alert and oriented x 3. No focal neuro deficits noted. PSYCHIATRIC:  Normal affect    ASSESSMENT AND PLAN: .    Hypertension Hair loss, started on minoxidil -we discussed options, based on whether she wished to continue the minoxidil. With plans to continue minoxidil, will stop amlodipine . Continue olmesartan . Monitor home blood pressures (given instructions on when to contact us  in AVS) -her prior notes indicate normal echo in the past (full report not available)  Palpitations: rare, brief. She will contact me if these worsen  CV risk counseling and prevention -recommend heart healthy/Mediterranean diet, with whole grains, fruits, vegetable, fish, lean meats, nuts, and olive oil. Limit salt. -recommend moderate walking, 3-5 times/week for 30-50 minutes each session. Aim for at least 150 minutes/week. Goal should be pace of 3 miles/hours, or walking 1.5 miles in 30 minutes -recommend avoidance of tobacco products. Avoid excess alcohol. -ASCVD risk score: The 10-year ASCVD risk score (Arnett DK, et al., 2019) is: 3.7%   Values used to calculate  the score:     Age: 38 years     Clincally relevant sex: Female     Is Non-Hispanic African American: No     Diabetic: No     Tobacco smoker: No     Systolic Blood Pressure: 124 mmHg     Is BP treated: Yes     HDL Cholesterol: 67.1 mg/dL     Total Cholesterol: 174 mg/dL    Dispo: 6 mos  Signed, Shelda Bruckner, MD   Shelda Bruckner, MD, PhD, [facility]  [facility]  [place]  [facility] at [facility] at [facility] [address] [place], [place] [address] [phone]

## 2024-11-19 NOTE — Patient Instructions (Addendum)
 Aim for blood pressure of 130/80. Continue the olmesartan  40 mg daily. Stop amlodipine  today, restart minoxidil 2.5 mg tomorrow. Follow the blood pressure. May take a few days for blood pressure to adapt to being off of the amlodipine , but within a few days, we should have an idea of where your blood pressure will be on minoxidil and olmesartan . If blood pressure is above 130/80 consistently, please let us  know and we will add back a lower dose of amlodipine .    Return in about 6 months (May 2026)

## 2024-11-26 ENCOUNTER — Encounter (HOSPITAL_BASED_OUTPATIENT_CLINIC_OR_DEPARTMENT_OTHER): Payer: Self-pay

## 2024-11-30 ENCOUNTER — Other Ambulatory Visit: Payer: Self-pay

## 2024-11-30 ENCOUNTER — Telehealth: Payer: Self-pay

## 2024-11-30 ENCOUNTER — Ambulatory Visit

## 2024-11-30 ENCOUNTER — Ambulatory Visit: Admitting: Family Medicine

## 2024-11-30 VITALS — BP 132/78 | HR 84 | Ht 64.0 in | Wt 197.0 lb

## 2024-11-30 DIAGNOSIS — M25562 Pain in left knee: Secondary | ICD-10-CM

## 2024-11-30 DIAGNOSIS — G8929 Other chronic pain: Secondary | ICD-10-CM

## 2024-11-30 DIAGNOSIS — M25561 Pain in right knee: Secondary | ICD-10-CM | POA: Diagnosis not present

## 2024-11-30 DIAGNOSIS — M170 Bilateral primary osteoarthritis of knee: Secondary | ICD-10-CM | POA: Diagnosis not present

## 2024-11-30 DIAGNOSIS — Z472 Encounter for removal of internal fixation device: Secondary | ICD-10-CM | POA: Diagnosis not present

## 2024-11-30 NOTE — Patient Instructions (Addendum)
 Thank you for coming in today.   Please get an Xray today before you leave   We will work to authorize gel shots for both of your knees. Our office will call you to schedule, once we get approval.

## 2024-11-30 NOTE — Telephone Encounter (Signed)
 From ov note 11/20 with Dr. Lonni:  Aim for blood pressure of 130/80. Continue the olmesartan  40 mg daily. Stop amlodipine  today, restart minoxidil 2.5 mg tomorrow. Follow the blood pressure. May take a few days for blood pressure to adapt to being off of the amlodipine , but within a few days, we should have an idea of where your blood pressure will be on minoxidil and olmesartan . If blood pressure is above 130/80 consistently, please let us  know and we will add back a lower dose of amlodipine .       _______________________________________________________  Will route to Dr. Lonni for review and recommendations.

## 2024-11-30 NOTE — Progress Notes (Signed)
   LILLETTE Ileana Collet, PhD, LAT, ATC acting as a scribe for Artist Lloyd, MD.  Faith Stevens is a 63 y.o. female who presents to [facility] today for bilat knee pain. Pt was previously seen by Dr. Lloyd on 06/30/24 for LBP and osteoporosis.  Today, pt c/o bilat knee pain, R>L x that's chronic in nature. Hx of a R tibial plateau fx and surgery (hardware removal Jan 2024). Pt locates pain to the anterior-lateral aspect of both knees  Knee swelling: yes Mechanical symptoms: yes Aggravates: walking, stairs Treatments tried: lidocaine  patch, stretch, oral diclofenac  Dx testing: 01/03/23 R knee XR  Pertinent review of systems: No fevers or chills  Relevant historical information: Hypertension History of right knee tibial plateau fracture with now removal of hardware recurring on January 2024  Exam:  BP 132/78   Pulse 84   Ht 5' 4 (1.626 m)   Wt 197 lb (89.4 kg)   SpO2 98%   BMI 33.81 kg/m  General: Well Developed, well nourished, and in no acute distress.   MSK: Right knee mature scar anterior lateral knee.  Nontender to palpation normal motion.  Left knee mild swelling normal motion.    Lab and Radiology Results  X-ray images bilateral knees obtained today personally and independently interpreted.  Right knee: Prior tibial plateau fracture.  Hardware has been removed.  Moderate lateral DJD.  No acute fracture is visible.  Left knee: Moderate lateral DJD  Await formal radiology review    Assessment and Plan: 63 y.o. female with bilateral knee pain right worse than left due to DJD.  Patient has already tried oral and topical Voltaren, Tylenol , exercises and physical therapy.  Symptoms persist.  Plan for authorization of single dose gel injection for both knees.   PDMP not reviewed this encounter. Orders Placed This Encounter  Procedures   DG Knee AP/LAT W/Sunrise Right    Standing Status:   Future    Number of Occurrences:   1     Expiration Date:   12/31/2024    Reason for Exam (SYMPTOM  OR DIAGNOSIS REQUIRED):   bilateral knee pain    Preferred imaging location?:   [facility]   DG Knee AP/LAT W/Sunrise Left    Standing Status:   Future    Number of Occurrences:   1    Expiration Date:   12/31/2024    Reason for Exam (SYMPTOM  OR DIAGNOSIS REQUIRED):   bilateral knee pain    Preferred imaging location?:   [facility]   No orders of the defined types were placed in this encounter.    Discussed warning signs or symptoms. Please see discharge instructions. Patient expresses understanding.   The above documentation has been reviewed and is accurate and complete Artist Lloyd, M.D.

## 2024-11-30 NOTE — Telephone Encounter (Signed)
 Please auth SINGLE dose gel shots, BILAT knees

## 2024-12-01 NOTE — Telephone Encounter (Signed)
 Ran Monovisc benefits for bilateral knee Case [id number]

## 2024-12-02 ENCOUNTER — Encounter: Payer: Self-pay | Admitting: Family Medicine

## 2024-12-02 ENCOUNTER — Other Ambulatory Visit: Payer: Self-pay | Admitting: Gastroenterology

## 2024-12-02 NOTE — Telephone Encounter (Signed)
 Needed a precert and preferred drug is synvisc. Change medication to synvisc

## 2024-12-03 MED ORDER — DICLOFENAC SODIUM ER 100 MG PO TB24: 100.0000 mg | ORAL_TABLET | Freq: Every day | ORAL | 2 refills | Status: AC | PRN

## 2024-12-03 NOTE — Telephone Encounter (Signed)
 Voltaren 100 mg not on current med list.   Forwarding to Dr. Joane.   Renal labs 08/27/24  Component Ref Range & Units (hover) 3 mo ago (08/27/24) 1 yr ago (08/26/23) 1 yr ago (01/03/23) 3 yr ago (08/27/21) 3 yr ago (08/26/21) 3 yr ago (08/25/21) 3 yr ago (08/24/21)  Sodium 139 140 138 R 137 R 135 R 137 R 135 R  Potassium 4.1 4.2 4.1 R 4.7 R 4.0 R 4.1 R 3.3 Low  R  Chloride 101 102 101 R 104 R 103 R 105 R 100 R  CO2 30 31 27  R 24 R 27 R 24 R 25 R  Glucose, Bld 101 High  101 High  114 High  CM 114 High  CM 106 High  CM 99 CM 116 High  CM  BUN 19 19 16  R 11 R 7 R 10 R 17 R  Creatinine, Ser 0.67 0.71 0.73 R 0.54 R 0.57 R 0.55 R 0.62 R  Total Bilirubin 0.3 0.3     0.9 R  Alkaline Phosphatase 93 80     77 R  AST 21 17     21  R  ALT 19 15     39 R  Total Protein 7.7 6.9     6.9 R  Albumin 4.8 4.5     3.9 R  GFR 94.69 92.77 CM       Comment: Calculated using the CKD-EPI Creatinine Equation (2021)  Calcium 9.4 9.5 9.0 R 8.7 Low  R 8.6 Low  R 8.7 Low  R 9.3 R

## 2024-12-05 ENCOUNTER — Encounter: Payer: Self-pay | Admitting: Dermatology

## 2024-12-05 ENCOUNTER — Other Ambulatory Visit: Payer: Self-pay | Admitting: Gastroenterology

## 2024-12-06 ENCOUNTER — Encounter (HOSPITAL_BASED_OUTPATIENT_CLINIC_OR_DEPARTMENT_OTHER): Payer: Self-pay | Admitting: Cardiology

## 2024-12-06 ENCOUNTER — Other Ambulatory Visit: Payer: Self-pay | Admitting: Gastroenterology

## 2024-12-08 ENCOUNTER — Ambulatory Visit: Payer: Self-pay | Admitting: Family Medicine

## 2024-12-08 DIAGNOSIS — L658 Other specified nonscarring hair loss: Secondary | ICD-10-CM | POA: Diagnosis not present

## 2024-12-08 NOTE — Progress Notes (Signed)
 Left knee x-ray shows arthritis worse in the lateral compartment of the knee.

## 2024-12-08 NOTE — Progress Notes (Signed)
 Right knee x-ray shows arthritis worse in the lateral compartment of the knee.

## 2024-12-09 ENCOUNTER — Telehealth: Payer: Self-pay | Admitting: Gastroenterology

## 2024-12-09 MED ORDER — ONDANSETRON HCL 4 MG PO TABS: 4.0000 mg | ORAL_TABLET | ORAL | 3 refills | Status: AC | PRN

## 2024-12-09 MED ORDER — HYOSCYAMINE SULFATE 0.125 MG SL SUBL: 0.1250 mg | SUBLINGUAL_TABLET | Freq: Four times a day (QID) | SUBLINGUAL | 3 refills | Status: AC | PRN

## 2024-12-09 NOTE — Telephone Encounter (Signed)
 Patient is requesting a refill for ondansetron  and hyoscyamine . Patient has been scheduled for 1/26. Please advise, thank you

## 2024-12-09 NOTE — Telephone Encounter (Signed)
Prescriptions sent to patient's pharmacy until scheduled appt. 

## 2024-12-10 NOTE — Telephone Encounter (Signed)
 Pt reached out via MyChart to f/u on gel shots for knee OA.

## 2024-12-11 MED ORDER — AMLODIPINE BESYLATE 5 MG PO TABS: 5.0000 mg | ORAL_TABLET | Freq: Every day | ORAL | 0 refills | Status: DC

## 2024-12-22 ENCOUNTER — Ambulatory Visit: Admitting: Family Medicine

## 2024-12-22 ENCOUNTER — Other Ambulatory Visit: Payer: Self-pay

## 2024-12-22 DIAGNOSIS — M25562 Pain in left knee: Secondary | ICD-10-CM

## 2024-12-22 DIAGNOSIS — G8929 Other chronic pain: Secondary | ICD-10-CM

## 2024-12-22 DIAGNOSIS — M170 Bilateral primary osteoarthritis of knee: Secondary | ICD-10-CM | POA: Diagnosis not present

## 2024-12-22 DIAGNOSIS — M25561 Pain in right knee: Secondary | ICD-10-CM | POA: Diagnosis not present

## 2024-12-22 MED ORDER — HYLAN G-F 20 48 MG/6ML IX SOSY
48.0000 mg | PREFILLED_SYRINGE | Freq: Once | INTRA_ARTICULAR | Status: AC
  Administered 2024-12-22: 48 mg via INTRA_ARTICULAR

## 2024-12-22 NOTE — Progress Notes (Signed)
 Faith Stevens presents to clinic today for Synvisc 1 injection bilateral knee 1/1 Procedure: Real-time Ultrasound Guided Injection of right knee joint superior lateral patellar space Device: Philips Affiniti 50G/GE Logiq Images permanently stored and available for review in PACS Verbal informed consent obtained.  Discussed risks and benefits of procedure. Warned about infection, bleeding, damage to structures among others. Patient expresses understanding and agreement Time-out conducted.   Noted no overlying erythema, induration, or other signs of local infection.   Skin prepped in a sterile fashion.   Local anesthesia: Topical Ethyl chloride.   With sterile technique and under real time ultrasound guidance: 6 mL injected into knee joint. Fluid seen entering the joint capsule.   Completed without difficulty   Advised to call if fevers/chills, erythema, induration, drainage, or persistent bleeding.   Images permanently stored and available for review in the ultrasound unit.  Impression: Technically successful ultrasound guided injection.  Procedure: Real-time Ultrasound Guided Injection of left knee joint superior lateral patellar space Device: Philips Affiniti 50G/GE Logiq Images permanently stored and available for review in PACS Verbal informed consent obtained.  Discussed risks and benefits of procedure. Warned about infection, bleeding, damage to structures among others. Patient expresses understanding and agreement Time-out conducted.   Noted no overlying erythema, induration, or other signs of local infection.   Skin prepped in a sterile fashion.   Local anesthesia: Topical Ethyl chloride.   With sterile technique and under real time ultrasound guidance: 6 mL injected into knee joint. Fluid seen entering the joint capsule.   Completed without difficulty   Advised to call if fevers/chills, erythema, induration, drainage, or persistent bleeding.   Images permanently stored and  available for review in the ultrasound unit.  Impression: Technically successful ultrasound guided injection. Lot number: [id number]

## 2024-12-22 NOTE — Patient Instructions (Signed)
Thank you for coming in today.   Call or go to the ER if you develop a large red swollen joint with extreme pain or oozing puss.   

## 2025-01-14 ENCOUNTER — Encounter: Payer: Self-pay | Admitting: Gastroenterology

## 2025-01-14 ENCOUNTER — Other Ambulatory Visit (HOSPITAL_BASED_OUTPATIENT_CLINIC_OR_DEPARTMENT_OTHER): Payer: Self-pay | Admitting: Cardiology

## 2025-01-14 DIAGNOSIS — R109 Unspecified abdominal pain: Secondary | ICD-10-CM

## 2025-01-14 DIAGNOSIS — Z8719 Personal history of other diseases of the digestive system: Secondary | ICD-10-CM

## 2025-01-18 NOTE — Telephone Encounter (Signed)
 No appointments at [facility]. Appointment for CT at [facility] on Friday, 01/22/25 @ 5 PM with arrival time of 2:45 PM. Attempted to reach patient but get a fast busy signal x 3. MyChart message also sent to patient.

## 2025-01-22 ENCOUNTER — Telehealth: Payer: Self-pay | Admitting: Gastroenterology

## 2025-01-22 ENCOUNTER — Ambulatory Visit (HOSPITAL_BASED_OUTPATIENT_CLINIC_OR_DEPARTMENT_OTHER)
Admission: RE | Admit: 2025-01-22 | Discharge: 2025-01-22 | Disposition: A | Source: Ambulatory Visit | Attending: Gastroenterology

## 2025-01-22 DIAGNOSIS — R109 Unspecified abdominal pain: Secondary | ICD-10-CM | POA: Insufficient documentation

## 2025-01-22 DIAGNOSIS — Z8719 Personal history of other diseases of the digestive system: Secondary | ICD-10-CM | POA: Diagnosis present

## 2025-01-22 MED ORDER — IOHEXOL 300 MG/ML  SOLN
100.0000 mL | Freq: Once | INTRAMUSCULAR | Status: AC | PRN
  Administered 2025-01-22: 100 mL via INTRAVENOUS

## 2025-01-22 NOTE — Telephone Encounter (Signed)
 Patient was scheduled for 01/25/25 and due to the weather, office will be closed.  Patient rescheduled until 02/26/25.   She is having a CT scan done today and Dr. Stacia wanted her seen on Monday to discuss results.  Patient would like a call as soon as CT results come back to discuss.

## 2025-01-22 NOTE — Telephone Encounter (Signed)
 When CT results and Dr Stacia reviews these, we will call with any recommendations he may have.

## 2025-01-24 ENCOUNTER — Ambulatory Visit: Payer: Self-pay | Admitting: Gastroenterology

## 2025-01-24 NOTE — Progress Notes (Signed)
 Faith Stevens,  Your CT scan was unremarkable.  There was no evidence of diverticulitis or other inflammatory changes in your GI tract.  This is most likely IBS.  Are your symptoms improving?

## 2025-01-25 ENCOUNTER — Ambulatory Visit: Admitting: Gastroenterology

## 2025-01-27 LAB — POCT I-STAT CREATININE: Creatinine, Ser: 0.8 mg/dL (ref 0.44–1.00)

## 2025-02-26 ENCOUNTER — Ambulatory Visit: Admitting: Gastroenterology

## 2025-04-12 ENCOUNTER — Ambulatory Visit: Admitting: Dermatology
